# Patient Record
Sex: Female | Born: 1965 | Race: Black or African American | Hispanic: No | Marital: Single | Smoking: Never smoker
Health system: Southern US, Community
[De-identification: ages and names within clinical notes are randomized; demographics above are authoritative.]

## PROBLEM LIST (undated history)

## (undated) DIAGNOSIS — E079 Disorder of thyroid, unspecified: Secondary | ICD-10-CM

## (undated) DIAGNOSIS — M419 Scoliosis, unspecified: Secondary | ICD-10-CM

## (undated) DIAGNOSIS — G4726 Circadian rhythm sleep disorder, shift work type: Secondary | ICD-10-CM

## (undated) HISTORY — PX: FOOT SURGERY: SHX648

## (undated) HISTORY — DX: Circadian rhythm sleep disorder, shift work type: G47.26

## (undated) HISTORY — DX: Scoliosis, unspecified: M41.9

## (undated) HISTORY — DX: Disorder of thyroid, unspecified: E07.9

## (undated) HISTORY — PX: TUBAL LIGATION: SHX77

---

## 1985-04-11 HISTORY — PX: OTHER SURGICAL HISTORY: SHX169

## 2001-03-16 ENCOUNTER — Other Ambulatory Visit: Admission: RE | Admit: 2001-03-16 | Discharge: 2001-03-16 | Payer: Self-pay | Admitting: Obstetrics and Gynecology

## 2002-10-30 ENCOUNTER — Encounter: Payer: Self-pay | Admitting: Family Medicine

## 2002-10-30 ENCOUNTER — Ambulatory Visit (HOSPITAL_COMMUNITY): Admission: RE | Admit: 2002-10-30 | Discharge: 2002-10-30 | Payer: Self-pay | Admitting: Family Medicine

## 2002-12-04 ENCOUNTER — Ambulatory Visit (HOSPITAL_COMMUNITY): Admission: RE | Admit: 2002-12-04 | Discharge: 2002-12-04 | Payer: Self-pay | Admitting: Podiatry

## 2003-08-12 ENCOUNTER — Ambulatory Visit (HOSPITAL_COMMUNITY): Admission: RE | Admit: 2003-08-12 | Discharge: 2003-08-12 | Payer: Self-pay | Admitting: Internal Medicine

## 2003-11-17 ENCOUNTER — Ambulatory Visit (HOSPITAL_COMMUNITY): Admission: RE | Admit: 2003-11-17 | Discharge: 2003-11-17 | Payer: Self-pay | Admitting: Family Medicine

## 2005-08-29 ENCOUNTER — Ambulatory Visit (HOSPITAL_COMMUNITY): Admission: RE | Admit: 2005-08-29 | Discharge: 2005-08-29 | Payer: Self-pay | Admitting: Obstetrics and Gynecology

## 2005-09-21 ENCOUNTER — Ambulatory Visit (HOSPITAL_COMMUNITY): Admission: RE | Admit: 2005-09-21 | Discharge: 2005-09-21 | Payer: Self-pay | Admitting: Obstetrics and Gynecology

## 2007-05-30 ENCOUNTER — Other Ambulatory Visit: Admission: RE | Admit: 2007-05-30 | Discharge: 2007-05-30 | Payer: Self-pay | Admitting: Obstetrics and Gynecology

## 2007-08-27 ENCOUNTER — Ambulatory Visit (HOSPITAL_COMMUNITY): Admission: RE | Admit: 2007-08-27 | Discharge: 2007-08-27 | Payer: Self-pay | Admitting: Family Medicine

## 2008-07-11 ENCOUNTER — Other Ambulatory Visit: Admission: RE | Admit: 2008-07-11 | Discharge: 2008-07-11 | Payer: Self-pay | Admitting: Obstetrics and Gynecology

## 2008-07-15 ENCOUNTER — Ambulatory Visit (HOSPITAL_COMMUNITY): Admission: RE | Admit: 2008-07-15 | Discharge: 2008-07-15 | Payer: Self-pay | Admitting: Obstetrics & Gynecology

## 2008-07-29 ENCOUNTER — Ambulatory Visit: Payer: Self-pay | Admitting: Gastroenterology

## 2008-07-30 ENCOUNTER — Encounter: Payer: Self-pay | Admitting: Internal Medicine

## 2008-07-31 ENCOUNTER — Encounter: Payer: Self-pay | Admitting: Internal Medicine

## 2008-08-25 ENCOUNTER — Ambulatory Visit: Payer: Self-pay | Admitting: Internal Medicine

## 2008-08-25 ENCOUNTER — Ambulatory Visit (HOSPITAL_COMMUNITY): Admission: RE | Admit: 2008-08-25 | Discharge: 2008-08-25 | Payer: Self-pay | Admitting: Internal Medicine

## 2008-08-25 ENCOUNTER — Encounter: Payer: Self-pay | Admitting: Internal Medicine

## 2008-08-25 HISTORY — PX: COLONOSCOPY: SHX5424

## 2008-08-27 ENCOUNTER — Encounter: Payer: Self-pay | Admitting: Internal Medicine

## 2008-08-27 ENCOUNTER — Telehealth (INDEPENDENT_AMBULATORY_CARE_PROVIDER_SITE_OTHER): Payer: Self-pay

## 2009-07-31 ENCOUNTER — Other Ambulatory Visit: Admission: RE | Admit: 2009-07-31 | Discharge: 2009-07-31 | Payer: Self-pay | Admitting: Obstetrics and Gynecology

## 2009-09-08 ENCOUNTER — Ambulatory Visit (HOSPITAL_COMMUNITY): Admission: RE | Admit: 2009-09-08 | Discharge: 2009-09-08 | Payer: Self-pay | Admitting: Obstetrics and Gynecology

## 2010-08-24 NOTE — Op Note (Signed)
NAME:  [name], Tammy                  ACCOUNT NO.:  [id number]   MEDICAL RECORD NO.:  [id number]          PATIENT TYPE:  AMB   LOCATION:  DAY                           FACILITY:  [facility]   PHYSICIAN:  R. Roetta Sessions, M.D. DATE OF BIRTH:  [birth year]   DATE OF PROCEDURE:  08/25/2008  DATE OF DISCHARGE:                               OPERATIVE REPORT   PROCEDURE PERFORMED:  Colonoscopy with biopsy.   INDICATIONS FOR PROCEDURE:  The patient is a  45 year old lady with a  positive family history of colon cancer in her sister who was diagnosed  at age 50.  She is here for high risk screening.  Potential risks,  benefits, alternatives and limitations have been reviewed, questions  have been answered.  She has no lower tract symptoms currently.  All  parties are agreeable.   PROCEDURE NOTE:  Oxygen saturations, blood pressure, pulse and  respirations were monitored throughout the entire procedure.  Conscious  sedation Versed 3 mg IV, Demerol 75 mg IV in divided doses.   INSTRUMENT USED:  Olympus video chip system.   FINDINGS:  Digital rectal exam revealed no abnormalities.   ENDOSCOPIC FINDINGS:  Prep was good.  Colon:  Colonic mucosa was surveyed from the rectosigmoid junction to  the left, transverse and right colon to the area of the appendiceal  orifice and ileocecal valve and cecum.  These structures were well seen  and photographed for the record.  From this level, the scope was slowly  withdrawn.  All previously mentioned mucosal surfaces were again seen.  The patient had a diminutive ascending colon polyp which was cold  biopsied/removed.  Remainder of the colonic mucosa appeared normal.  The  scope was pulled down to the rectum where a thorough examination of the  rectal mucosa including retroflexion view of the anal verge demonstrated  no abnormalities.  The patient tolerated the procedure well, was reacted  in endoscopy.  Cecal withdrawal time 11 minutes.   IMPRESSION:  1.  Normal rectum.  2. Diminutive ascending colon polyp, status post cold biopsy removal.      Remainder of colonic mucosa appeared normal.   RECOMMENDATIONS:  Follow up on pathology.  Further recommendations to  follow.      Jonathon Bellows, M.D.  Electronically Signed     RMR/MEDQ  D:  08/25/2008  T:  08/25/2008  Job:  [id number]   cc:   Kirk Ruths, M.D.  Fax: [phone]

## 2010-08-27 ENCOUNTER — Other Ambulatory Visit: Payer: Self-pay | Admitting: Obstetrics & Gynecology

## 2010-08-27 DIAGNOSIS — Z139 Encounter for screening, unspecified: Secondary | ICD-10-CM

## 2010-08-27 NOTE — Op Note (Signed)
NAME:  Wollen, Jaicey Salena Saner                            ACCOUNT NO.:  [id number]   MEDICAL RECORD NO.:  [id number]                   PATIENT TYPE:  AMB   LOCATION:  DAY                                  FACILITY:  [facility]   PHYSICIAN:  Cody M. Ulice Brilliant, D.P.M.               DATE OF BIRTH:  03/06/1966   DATE OF PROCEDURE:  12/04/2002  DATE OF DISCHARGE:                                 OPERATIVE REPORT   PREOPERATIVE DIAGNOSES:  1. Tailor's bunion deformity, fifth metatarsal, right foot.  2. Hammer toe second digit right foot.  3. Hammer toe third digit right foot.   POSTOPERATIVE DIAGNOSES:  1. Tailor's bunion deformity, fifth metatarsal, right foot.  2. Hammer toe second digit right foot.  3. Hammer toe third digit right foot.   PROCEDURES PERFORMED:  1. Fifth metatarsal osteotomy, right foot.  2. Hammer toe arthrodesis, second digit right foot.  3. Hammer toe arthrodesis, third digit right foot.   SURGEON:  Denny Peon. Ulice Brilliant, D.P.M.   ANESTHESIA:  Monitored anesthesia care.   INDICATION FOR SURGERY:  Painful forefoot deformities consisting of a  tailor's bunion of the right foot with pain along the lateral and plantar  aspect of the fifth metatarsal unresponsive to conservative measures,  including changes in shoe gear and nonsteroidal anti-inflammatory  medication; hammer toes of the second and third digits right foot with  painful hyperkeratotic lesion on third digit right foot.  The patient has  requested surgical correction.   DESCRIPTION OF PROCEDURE:  Ms. Chipman is brought into the OR and placed on the  table in the supine position.  IV sedation is established.  A Mayo block is  performed about the fifth MTP of her right foot.  Further local anesthesia  is administered about the dorsal aspect of the right foot, and a digital  block is performed about the second and third toes.  A pneumatic ankle  tourniquet is then applied over her right ankle.  Her foot is then prepped  and draped in  the usual aseptic fashion.  An Ace bandage is then utilized to  exsanguinate her foot.  The tourniquet is inflated to 250 mmHg.   Procedure #1.  Fifth metatarsal osteotomy, right foot:  Attention is  directed to the fifth metatarsal area.  A 4-5 cm slight curvilinear skin  incision is made from the head of the fifth metatarsal to the midshaft.  The  incision is deepened through subcutaneous tissue via sharp and blunt  dissection.  The extensor tendon to the fifth toe is retracted medially.  A  deep fascial incision is then created.  The capsular and periosteal tissues  are reflected then away from the underlying bony surface, exposing the  surgical neck of the bone.  The osteotomy is then performed with the  orientation of the osteotomy being from distal dorsal medial to plantar  lateral proximal.  The  capital fragment is then relocated medially and  slightly dorsally to place the fifth metatarsal head more medially and  slightly more dorsally to reduce shoe and weightbearing pressure.  With the  capital fragment relocated into its corrected position, it is then fixated  via 2.0 x 12 mm cortical screw in standard AO/ASIS fashion.  The osteotomy  is deemed stable.  The wound is flushed.  Redundant bone laterally is  excised with the bone rongeur.  Deep fascia is then reapproximated and  closed with 4-0 Vicryl in a running horizontal mattress suture.  Subcutaneous tissues are reapproximated and closed in a running horizontal  mattress suture of 4-0 Vicryl.  Skin is closed with 4-0 Vicryl in a  subcuticular suture.   Procedure #2.  Hammer toe arthrodesis, second digit of right foot:  Attention is directed to the second toe.  A 5 cm dorsal linear skin incision  is then made extending in a slight curvilinear skin incision crossing the  second MTP.  The incision is deepened through subcutaneous tissue via blunt  dissection.  The proximal interphalangeal joint and the metatarsophalangeal  joint  are readily identified and good exposure is gained of these.  An  incision is then made transversely across the extensor tendon.  The head of  the proximal phalanx and the base of the intermediate phalanx are exposed.  The articular surface of the head of the proximal phalanx and the base of  the intermediate phalanx are then resected utilizing a 62 blade on the  oscillating saw.  An extensor hood release is then performed.  An MTP  capsulotomy is then performed with the McGlamery elevator utilized to free  up the flexor plate plantarly.  A 0.045 K-wire is then introduced and driven  from the base of the intermediate phalanx through the distal aspect of the  toe.  The K-wire is then retrograded proximally across the proximal  interphalangeal joint through the proximal phalangeal head, and the K-wire  is driven proximally through the proximal phalanx, crossing the  metatarsophalangeal joint.  Care is taken that there is good orientation of  the second toe in all three body planes.  With the K-wire in place and the  opposing bony surfaces of the proximal phalanx and intermediate phalanx  against each other, the wound is flushed.  Closure about the dorsal aspect  of the MTP is created utilizing 4-0 Vicryl in a horizontal mattress suture.  The entire skin incision is then closed from distal to proximal utilizing 4-  0 Prolene in a running interlocking suture.   Procedure #3.  Hammer toe arthrodesis third toe right foot:  The above-noted  procedure that was described on the second toe was then performed on the  third toe; in fact, these two procedures were performed simultaneously.  There was no variation in amount of bone removed or fixation.   Ms. Habermehl is then injected postoperatively about all three incisions with  Marcaine and Hexadrol.  Steri-Strips are applied across the fifth MTP.  A Betadine-soaked Adaptic dressing and a dry sterile compressive dressing  follow.  The tourniquet is then  deflated.   Ms. Breese is taken to recovery from room 3.  While in recovery I speak with  her sister regarding her postoperative instructions.  A prescription for  Lortab 10 mg is dispensed and a prescription also for Phenergan 25 mg is  dispensed.  She will be seen within one week for her first postop visit.  Her sister is  described use of ice, elevation, staying off the foot, and  keeping it dry.                                                Denny Peon. Ulice Brilliant, D.P.M.    CMD/MEDQ  D:  12/04/2002  T:  12/04/2002  Job:  [id number]

## 2010-08-27 NOTE — Op Note (Signed)
NAME:  Tammy [name], Tammy [name]                            ACCOUNT NO.:  [id number]   MEDICAL RECORD NO.:  [id number]                   PATIENT TYPE:  AMB   LOCATION:  DAY                                  FACILITY:  [facility]   PHYSICIAN:  R. Roetta Sessions, M.D.              DATE OF BIRTH:  02/15/1966   DATE OF PROCEDURE:  08/12/2003  DATE OF DISCHARGE:                                 OPERATIVE REPORT   PROCEDURE:  High risk screening colonoscopy.   INDICATIONS FOR PROCEDURE:  The patient is a 45 year old African American  female devoid of any lower GI tract symptoms whose sister at age 89 was  diagnosed with colorectal carcinoma and recently had a resection.  Tammy [name]  has never had her lower GI tract imaged.  Colonoscopy is now being done as a  screening maneuver.  This approach has been discussed with the patient at  length at the bedside.  The potential risks, benefits, and alternatives have  been reviewed and questions answered.  She is agreeable.  Please see my  handwritten H&P for more information.   PROCEDURE:  O2 saturation, blood pressure, pulses, and respirations were  monitored throughout the entirety of the procedure.  Conscious sedation was  with Versed 2 mg IV, Demerol 500 mg IV in divided doses.  The instrument  used was the Olympus video chip system.   FINDINGS:  Digital rectal examination revealed no abnormalities.   ENDOSCOPIC FINDINGS:  The prep was good.   Rectum:  Examination of the rectal mucosa including retroflex view of the  anal verge revealed no abnormalities.   Colon:  The colonic mucosa was surveyed from the rectosigmoid junction  through the left, transverse, right colon to the area of the appendiceal  orifice, ileocecal valve, and cecum.  These structures were well-seen and  photographed for the record.  From this level, the scope was slowly  withdrawn.  All previously mentioned mucosal surfaces were again seen.  The  colonic mucosa appeared normal.  The patient  tolerated the procedure well  and was reactive in endoscopy.   IMPRESSION:  1. Normal rectum.  2. Normal colon.   RECOMMENDATIONS:  Repeat colonoscopy in five years.      ___________________________________________                                            Jonathon Bellows, M.D.   RMR/MEDQ  D:  08/12/2003  T:  08/12/2003  Job:  [id number]   cc:   Tilda Burrow, M.D.  [address] [place]  [place] [address]  Fax: [phone]

## 2010-09-16 ENCOUNTER — Ambulatory Visit (HOSPITAL_COMMUNITY)
Admission: RE | Admit: 2010-09-16 | Discharge: 2010-09-16 | Disposition: A | Discharge status: Home / Self Care | Payer: BC Managed Care – PPO | Source: Ambulatory Visit | Attending: Obstetrics & Gynecology | Admitting: Obstetrics & Gynecology

## 2010-09-16 DIAGNOSIS — Z139 Encounter for screening, unspecified: Secondary | ICD-10-CM

## 2010-09-16 DIAGNOSIS — Z1231 Encounter for screening mammogram for malignant neoplasm of breast: Secondary | ICD-10-CM | POA: Insufficient documentation

## 2011-08-10 ENCOUNTER — Other Ambulatory Visit: Payer: Self-pay | Admitting: Adult Health

## 2011-08-10 DIAGNOSIS — Z139 Encounter for screening, unspecified: Secondary | ICD-10-CM

## 2011-08-31 ENCOUNTER — Other Ambulatory Visit: Payer: Self-pay | Admitting: Adult Health

## 2011-08-31 ENCOUNTER — Other Ambulatory Visit (HOSPITAL_COMMUNITY)
Admission: RE | Admit: 2011-08-31 | Discharge: 2011-08-31 | Disposition: A | Discharge status: Home / Self Care | Payer: BC Managed Care – PPO | Source: Ambulatory Visit | Attending: Obstetrics and Gynecology | Admitting: Obstetrics and Gynecology

## 2011-08-31 DIAGNOSIS — Z1159 Encounter for screening for other viral diseases: Secondary | ICD-10-CM | POA: Insufficient documentation

## 2011-08-31 DIAGNOSIS — Z01419 Encounter for gynecological examination (general) (routine) without abnormal findings: Secondary | ICD-10-CM | POA: Insufficient documentation

## 2011-09-19 ENCOUNTER — Ambulatory Visit (HOSPITAL_COMMUNITY)
Admission: RE | Admit: 2011-09-19 | Discharge: 2011-09-19 | Disposition: A | Discharge status: Home / Self Care | Payer: BC Managed Care – PPO | Source: Ambulatory Visit | Attending: Adult Health | Admitting: Adult Health

## 2011-09-19 DIAGNOSIS — Z1231 Encounter for screening mammogram for malignant neoplasm of breast: Secondary | ICD-10-CM | POA: Insufficient documentation

## 2011-09-19 DIAGNOSIS — Z139 Encounter for screening, unspecified: Secondary | ICD-10-CM

## 2012-08-20 ENCOUNTER — Other Ambulatory Visit: Payer: Self-pay | Admitting: Obstetrics and Gynecology

## 2012-08-20 DIAGNOSIS — Z139 Encounter for screening, unspecified: Secondary | ICD-10-CM

## 2012-09-21 ENCOUNTER — Ambulatory Visit (HOSPITAL_COMMUNITY)
Admission: RE | Admit: 2012-09-21 | Discharge: 2012-09-21 | Disposition: A | Discharge status: Home / Self Care | Payer: BC Managed Care – PPO | Source: Ambulatory Visit | Attending: Obstetrics and Gynecology | Admitting: Obstetrics and Gynecology

## 2012-09-21 DIAGNOSIS — Z1231 Encounter for screening mammogram for malignant neoplasm of breast: Secondary | ICD-10-CM | POA: Insufficient documentation

## 2012-09-21 DIAGNOSIS — Z139 Encounter for screening, unspecified: Secondary | ICD-10-CM

## 2013-04-26 ENCOUNTER — Encounter: Payer: Self-pay | Admitting: Adult Health

## 2013-04-26 ENCOUNTER — Encounter (INDEPENDENT_AMBULATORY_CARE_PROVIDER_SITE_OTHER): Payer: Self-pay

## 2013-04-26 ENCOUNTER — Ambulatory Visit (INDEPENDENT_AMBULATORY_CARE_PROVIDER_SITE_OTHER): Payer: BC Managed Care – PPO | Admitting: Adult Health

## 2013-04-26 VITALS — BP 132/80 | HR 76 | Ht 67.0 in | Wt 172.0 lb

## 2013-04-26 DIAGNOSIS — Z01419 Encounter for gynecological examination (general) (routine) without abnormal findings: Secondary | ICD-10-CM

## 2013-04-26 DIAGNOSIS — G4726 Circadian rhythm sleep disorder, shift work type: Secondary | ICD-10-CM

## 2013-04-26 DIAGNOSIS — Z1212 Encounter for screening for malignant neoplasm of rectum: Secondary | ICD-10-CM

## 2013-04-26 HISTORY — DX: Circadian rhythm sleep disorder, shift work type: G47.26

## 2013-04-26 LAB — HEMOCCULT GUIAC POC 1CARD (OFFICE): Fecal Occult Blood, POC: NEGATIVE

## 2013-04-26 MED ORDER — ZOLPIDEM TARTRATE 5 MG PO TABS: 5.0000 mg | ORAL_TABLET | Freq: Every evening | ORAL | Status: DC | PRN

## 2013-04-26 NOTE — Progress Notes (Signed)
Patient ID: Tammy [name], female   DOB: 02/02/1966, 48 y.o.   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   Current Medications, Allergies, Past Medical History, Past Surgical History, Family History and Social History were reviewed in [facility] electronic medical record.   Past Medical History  Diagnosis Date  . Thyroid disease   . Scoliosis   . 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 Thyroid removed  1987  . Foot surgery Right   . Tubal ligation    Current outpatient prescriptions:levothyroxine (SYNTHROID, LEVOTHROID) 100 MCG tablet, Take 100 mcg by mouth daily., Disp: , Rfl: ;  zolpidem (AMBIEN) 5 MG tablet, Take 1 tablet (5 mg total) by mouth at bedtime as needed for sleep., Disp: 30 tablet, Rfl: 0  Review of Systems: Patient denies any headaches, blurred vision, shortness of breath, chest pain, abdominal pain, problems with bowel movements, urination, or intercourse. No joint swelling or mood swings, no period in 2 years and no hot flashes.She does complain of not sleeping well, she works 3rd shift at [employer].She has used ambien in the past.    Physical Exam:BP 132/80  Pulse 76  Ht 5\' 7"  (1.702 m)  Wt 172 lb (78.019 kg)  BMI 26.93 kg/m2 General:  Well developed, well nourished, no acute distress Skin:  Warm and dry Neck:  Midline trachea, thyroid surgically removed, healed scar, no carotid bruits heard Lungs; Clear to auscultation bilaterally Breast:  No dominant palpable mass, retraction, or nipple discharge Cardiovascular: Regular rate and rhythm Abdomen:  Soft, non tender, no hepatosplenomegaly Pelvic:  External genitalia is normal in appearance.  The vagina is normal in  appearance.The cervix is bulbous.  Uterus is felt to be normal size, shape, and contour.  No  adnexal masses or tenderness noted. Rectal: Good sphincter tone, no polyps, or hemorrhoids felt.  Hemoccult negative. Extremities:  No swelling or varicosities noted Psych:  No mood changes, alert and cooperative,seems happy   Impression: Yearly gyn no pap Shift work sleep distrubance   Plan: Rx Ambien 5 mg #30 1 at hs prn no refills Physical in 1 year Mammogram yearly  Labs with PCP Colonoscopy per GI Review handout on insomnia Will call her about calcified vein probably needs to see PCP

## 2013-04-26 NOTE — Patient Instructions (Signed)
Insomnia Insomnia is frequent trouble falling and/or staying asleep. Insomnia can be a long term problem or a short term problem. Both are common. Insomnia can be a short term problem when the wakefulness is related to a certain stress or worry. Long term insomnia is often related to ongoing stress during waking hours and/or poor sleeping habits. Overtime, sleep deprivation itself can make the problem worse. Every little thing feels more severe because you are overtired and your ability to cope is decreased. CAUSES   Stress, anxiety, and depression.  Poor sleeping habits.  Distractions such as TV in the bedroom.  Naps close to bedtime.  Engaging in emotionally charged conversations before bed.  Technical reading before sleep.  Alcohol and other sedatives. They may make the problem worse. They can hurt normal sleep patterns and normal dream activity.  Stimulants such as caffeine for several hours prior to bedtime.  Pain syndromes and shortness of breath can cause insomnia.  Exercise late at night.  Changing time zones may cause sleeping problems (jet lag). It is sometimes helpful to have someone observe your sleeping patterns. They should look for periods of not breathing during the night (sleep apnea). They should also look to see how long those periods last. If you live alone or observers are uncertain, you can also be observed at a sleep clinic where your sleep patterns will be professionally monitored. Sleep apnea requires a checkup and treatment. Give your caregivers your medical history. Give your caregivers observations your family has made about your sleep.  SYMPTOMS   Not feeling rested in the morning.  Anxiety and restlessness at bedtime.  Difficulty falling and staying asleep. TREATMENT   Your caregiver may prescribe treatment for an underlying medical disorders. Your caregiver can give advice or help if you are using alcohol or other drugs for self-medication. Treatment  of underlying problems will usually eliminate insomnia problems.  Medications can be prescribed for short time use. They are generally not recommended for lengthy use.  Over-the-counter sleep medicines are not recommended for lengthy use. They can be habit forming.  You can promote easier sleeping by making lifestyle changes such as:  Using relaxation techniques that help with breathing and reduce muscle tension.  Exercising earlier in the day.  Changing your diet and the time of your last meal. No night time snacks.  Establish a regular time to go to bed.  Counseling can help with stressful problems and worry.  Soothing music and white noise may be helpful if there are background noises you cannot remove.  Stop tedious detailed work at least one hour before bedtime. HOME CARE INSTRUCTIONS   Keep a diary. Inform your caregiver about your progress. This includes any medication side effects. See your caregiver regularly. Take note of:  Times when you are asleep.  Times when you are awake during the night.  The quality of your sleep.  How you feel the next day. This information will help your caregiver care for you.  Get out of bed if you are still awake after 15 minutes. Read or do some quiet activity. Keep the lights down. Wait until you feel sleepy and go back to bed.  Keep regular sleeping and waking hours. Avoid naps.  Exercise regularly.  Avoid distractions at bedtime. Distractions include watching television or engaging in any intense or detailed activity like attempting to balance the household checkbook.  Develop a bedtime ritual. Keep a familiar routine of bathing, brushing your teeth, climbing into bed at the same   time each night, listening to soothing music. Routines increase the success of falling to sleep faster.  Use relaxation techniques. This can be using breathing and muscle tension release routines. It can also include visualizing peaceful scenes. You can  also help control troubling or intruding thoughts by keeping your mind occupied with boring or repetitive thoughts like the old concept of counting sheep. You can make it more creative like imagining planting one beautiful flower after another in your backyard garden.  During your day, work to eliminate stress. When this is not possible use some of the previous suggestions to help reduce the anxiety that accompanies stressful situations. MAKE SURE YOU:   Understand these instructions.  Will watch your condition.  Will get help right away if you are not doing well or get worse. Document Released: 03/25/2000 Document Revised: 06/20/2011 Document Reviewed: 04/25/2007 [facility] Patient Information 2014 [employer] [employer] [place]. Try ambien  Physical in 1 year Mammogram yearly Colonoscopy per GI Labs with PCP

## 2013-05-01 ENCOUNTER — Telehealth: Payer: Self-pay | Admitting: Adult Health

## 2013-05-01 NOTE — Telephone Encounter (Signed)
Left message to get her to see PCP about calcified vein in neck

## 2013-09-11 ENCOUNTER — Ambulatory Visit (INDEPENDENT_AMBULATORY_CARE_PROVIDER_SITE_OTHER): Payer: BC Managed Care – PPO | Admitting: Gastroenterology

## 2013-09-11 ENCOUNTER — Encounter (INDEPENDENT_AMBULATORY_CARE_PROVIDER_SITE_OTHER): Payer: Self-pay

## 2013-09-11 ENCOUNTER — Other Ambulatory Visit: Payer: BC Managed Care – PPO | Admitting: Internal Medicine

## 2013-09-11 ENCOUNTER — Encounter: Payer: Self-pay | Admitting: Gastroenterology

## 2013-09-11 VITALS — BP 126/83 | HR 90 | Temp 97.9°F | Resp 18 | Ht 67.0 in | Wt 172.6 lb

## 2013-09-11 DIAGNOSIS — Z86010 Personal history of colonic polyps: Secondary | ICD-10-CM

## 2013-09-11 DIAGNOSIS — Z860101 Personal history of adenomatous and serrated colon polyps: Secondary | ICD-10-CM | POA: Insufficient documentation

## 2013-09-11 DIAGNOSIS — Z800 Family history of malignant neoplasm of digestive organs: Secondary | ICD-10-CM | POA: Insufficient documentation

## 2013-09-11 MED ORDER — PEG 3350-KCL-NA BICARB-NACL 420 G PO SOLR: 4000.0000 mL | ORAL | Status: DC

## 2013-09-11 NOTE — Patient Instructions (Signed)
1. Colonoscopy as scheduled. See separate instructions.  

## 2013-09-11 NOTE — Progress Notes (Signed)
Primary Care Physician:  Colette Ribas, MD  Primary Gastroenterologist:  Roetta Sessions, MD   Chief Complaint  Patient presents with  . Follow-up    HPI:  Tammy [name] is a 48 y.o. female here to schedule surveillance colonoscopy for history of colon polyps. She also has FH of CRC, sister at age 35. Patient's last colonoscopy 08/2008. Patient has been doing well. No constipation, diarrhea, melena, rectal bleeding, abdominal pain, vomiting, heartburn, weight loss.  Current Outpatient Prescriptions  Medication Sig Dispense Refill  . levothyroxine (SYNTHROID, LEVOTHROID) 100 MCG tablet Take 100 mcg by mouth daily.      Marland Kitchen zolpidem (AMBIEN) 5 MG tablet Take 1 tablet (5 mg total) by mouth at bedtime as needed for sleep.  30 tablet  0   No current facility-administered medications for this visit.    Allergies as of 09/11/2013  . (No Known Allergies)    Past Medical History  Diagnosis Date  . Thyroid disease   . Scoliosis   . 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 Thyroid removed  1987  . Foot surgery Right   . Tubal ligation    . Colonoscopy  08/25/2008    [id number]:[id number] ascending colon polyp, status post cold biopsy removal/Remainder of colonic mucosa appeared normal/normal rectum. Tubular adenoma    Family History  Problem Relation Age of Onset  . Hypertension Mother   . Cancer Sister 70    colon  . Hypertension Maternal Aunt   . Hyperlipidemia Maternal Aunt   . Cancer Brother     lung    History   Social History  . Marital Status: Single    Spouse Name: N/A    Number of Children: N/A  . Years of Education: N/A   Occupational History  . Not on file.   Social History Main Topics  . Smoking status: Never Smoker   . Smokeless tobacco: Never Used  . Alcohol Use: Yes     Comment: occ  . Drug Use: No  . Sexual Activity: Yes    Birth Control/ Protection: Surgical    Other Topics Concern  . Not on file   Social History Narrative  . No narrative on file      ROS:  General: Negative for anorexia, weight loss, fever, chills, fatigue, weakness. Eyes: Negative for vision changes.  ENT: Negative for hoarseness, difficulty swallowing , nasal congestion. CV: Negative for chest pain, angina, palpitations, dyspnea on exertion, peripheral edema.  Respiratory: Negative for dyspnea at rest, dyspnea on exertion, cough, sputum, wheezing.  GI: See history of present illness. GU:  Negative for dysuria, hematuria, urinary incontinence, urinary frequency, nocturnal urination.  MS: Negative for joint pain, low back pain.  Derm: Negative for rash or itching.  Neuro: Negative for weakness, abnormal sensation, seizure, frequent headaches, memory loss, confusion.  Psych: Negative for anxiety, depression, suicidal ideation, hallucinations.  Endo: Negative for unusual weight change.  Heme: Negative for bruising or bleeding. Allergy: Negative for rash or hives.    Physical Examination:  BP 126/83  Pulse 90  Temp(Src) 97.9 F (36.6 C) (Oral)  Resp 18  Ht 5\' 7"  (1.702 m)  Wt 172 lb 9.6 oz (78.291 kg)  BMI 27.03 kg/m2   General: Well-nourished, well-developed in no acute distress.  Head: Normocephalic, atraumatic.   Eyes: Conjunctiva pink, no icterus. Mouth: Oropharyngeal mucosa moist and pink , no lesions  erythema or exudate. Neck: Supple without thyromegaly, masses, or lymphadenopathy.  Lungs: Clear to auscultation bilaterally.  Heart: Regular rate and rhythm, no murmurs rubs or gallops.  Abdomen: Bowel sounds are normal, nontender, nondistended, no hepatosplenomegaly or masses, no abdominal bruits or    hernia , no rebound or guarding.   Rectal: not performed Extremities: No lower extremity edema. No clubbing or deformities.  Neuro: Alert and oriented x 4 , grossly normal neurologically.  Skin: Warm and dry, no rash or jaundice.   Psych: Alert and  cooperative, normal mood and affect.

## 2013-09-11 NOTE — Assessment & Plan Note (Signed)
48 year old lady who presents to schedule five-year surveillance colonoscopy. She denies any GI symptoms. Family history positive for colon cancer, sister, age 42. She will like to schedule her colonoscopy during her upcoming vacation.  I have discussed the risks, alternatives, benefits with regards to but not limited to the risk of reaction to medication, bleeding, infection, perforation and the patient is agreeable to proceed. Written consent to be obtained.

## 2013-09-12 NOTE — Progress Notes (Signed)
cc'd to pcp 

## 2013-09-16 ENCOUNTER — Encounter (HOSPITAL_COMMUNITY): Payer: Self-pay | Admitting: Pharmacy Technician

## 2013-09-25 ENCOUNTER — Other Ambulatory Visit: Payer: Self-pay | Admitting: Adult Health

## 2013-09-25 DIAGNOSIS — Z1231 Encounter for screening mammogram for malignant neoplasm of breast: Secondary | ICD-10-CM

## 2013-09-26 ENCOUNTER — Ambulatory Visit (HOSPITAL_COMMUNITY)
Admission: RE | Admit: 2013-09-26 | Discharge: 2013-09-26 | Disposition: A | Discharge status: Home / Self Care | Payer: BC Managed Care – PPO | Source: Ambulatory Visit | Attending: Internal Medicine | Admitting: Internal Medicine

## 2013-09-26 ENCOUNTER — Encounter (HOSPITAL_COMMUNITY)
Admission: RE | Disposition: A | Discharge status: Home / Self Care | Payer: Self-pay | Source: Ambulatory Visit | Attending: Internal Medicine

## 2013-09-26 ENCOUNTER — Encounter (HOSPITAL_COMMUNITY): Payer: Self-pay | Admitting: *Deleted

## 2013-09-26 DIAGNOSIS — Z79899 Other long term (current) drug therapy: Secondary | ICD-10-CM | POA: Insufficient documentation

## 2013-09-26 DIAGNOSIS — G479 Sleep disorder, unspecified: Secondary | ICD-10-CM | POA: Insufficient documentation

## 2013-09-26 DIAGNOSIS — Z800 Family history of malignant neoplasm of digestive organs: Secondary | ICD-10-CM

## 2013-09-26 DIAGNOSIS — Z1211 Encounter for screening for malignant neoplasm of colon: Secondary | ICD-10-CM | POA: Insufficient documentation

## 2013-09-26 DIAGNOSIS — Z860100 Personal history of colon polyps, unspecified: Secondary | ICD-10-CM | POA: Insufficient documentation

## 2013-09-26 HISTORY — PX: COLONOSCOPY: SHX5424

## 2013-09-26 SURGERY — COLONOSCOPY
Anesthesia: Moderate Sedation

## 2013-09-26 MED ORDER — ONDANSETRON HCL 4 MG/2ML IJ SOLN
INTRAMUSCULAR | Status: DC | PRN
  Administered 2013-09-26: 4 mg via INTRAVENOUS

## 2013-09-26 MED ORDER — SODIUM CHLORIDE 0.9 % IV SOLN
INTRAVENOUS | Status: DC
  Administered 2013-09-26: 12:00:00 via INTRAVENOUS

## 2013-09-26 MED ORDER — MEPERIDINE HCL 100 MG/ML IJ SOLN
INTRAMUSCULAR | Status: AC
  Filled 2013-09-26: qty 2

## 2013-09-26 MED ORDER — MEPERIDINE HCL 100 MG/ML IJ SOLN
INTRAMUSCULAR | Status: DC | PRN
  Administered 2013-09-26: 50 mg via INTRAVENOUS

## 2013-09-26 MED ORDER — STERILE WATER FOR IRRIGATION IR SOLN
Status: DC | PRN
  Administered 2013-09-26: 12:00:00

## 2013-09-26 MED ORDER — MIDAZOLAM HCL 5 MG/5ML IJ SOLN
INTRAMUSCULAR | Status: DC | PRN
  Administered 2013-09-26: 2 mg via INTRAVENOUS
  Administered 2013-09-26 (×2): 1 mg via INTRAVENOUS

## 2013-09-26 MED ORDER — ONDANSETRON HCL 4 MG/2ML IJ SOLN
INTRAMUSCULAR | Status: AC
  Filled 2013-09-26: qty 2

## 2013-09-26 MED ORDER — MIDAZOLAM HCL 5 MG/5ML IJ SOLN
INTRAMUSCULAR | Status: AC
  Filled 2013-09-26: qty 10

## 2013-09-26 NOTE — H&P (View-Only) (Signed)
Primary Care Physician:  GOLDING, JOHN CABOT, MD  Primary Gastroenterologist:  Michael Rourk, MD   Chief Complaint  Patient presents with  . Follow-up    HPI:  Tammy [name] is a 48 y.o. female here to schedule surveillance colonoscopy for history of colon polyps. She also has FH of CRC, sister at age 44. Patient's last colonoscopy 08/2008. Patient has been doing well. No constipation, diarrhea, melena, rectal bleeding, abdominal pain, vomiting, heartburn, weight loss.  Current Outpatient Prescriptions  Medication Sig Dispense Refill  . levothyroxine (SYNTHROID, LEVOTHROID) 100 MCG tablet Take 100 mcg by mouth daily.      . zolpidem (AMBIEN) 5 MG tablet Take 1 tablet (5 mg total) by mouth at bedtime as needed for sleep.  30 tablet  0   No current facility-administered medications for this visit.    Allergies as of 09/11/2013  . (No Known Allergies)    Past Medical History  Diagnosis Date  . Thyroid disease   . Scoliosis   . 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 Thyroid removed  1987  . Foot surgery Right   . Tubal ligation    . Colonoscopy  08/25/2008    RMR:Diminutive ascending colon polyp, status post cold biopsy removal/Remainder of colonic mucosa appeared normal/normal rectum. Tubular adenoma    Family History  Problem Relation Age of Onset  . Hypertension Mother   . Cancer Sister 44    colon  . Hypertension Maternal Aunt   . Hyperlipidemia Maternal Aunt   . Cancer Brother     lung    History   Social History  . Marital Status: Single    Spouse Name: N/A    Number of Children: N/A  . Years of Education: N/A   Occupational History  . Not on file.   Social History Main Topics  . Smoking status: Never Smoker   . Smokeless tobacco: Never Used  . Alcohol Use: Yes     Comment: occ  . Drug Use: No  . Sexual Activity: Yes    Birth Control/ Protection: Surgical    Other Topics Concern  . Not on file   Social History Narrative  . No narrative on file      ROS:  General: Negative for anorexia, weight loss, fever, chills, fatigue, weakness. Eyes: Negative for vision changes.  ENT: Negative for hoarseness, difficulty swallowing , nasal congestion. CV: Negative for chest pain, angina, palpitations, dyspnea on exertion, peripheral edema.  Respiratory: Negative for dyspnea at rest, dyspnea on exertion, cough, sputum, wheezing.  GI: See history of present illness. GU:  Negative for dysuria, hematuria, urinary incontinence, urinary frequency, nocturnal urination.  MS: Negative for joint pain, low back pain.  Derm: Negative for rash or itching.  Neuro: Negative for weakness, abnormal sensation, seizure, frequent headaches, memory loss, confusion.  Psych: Negative for anxiety, depression, suicidal ideation, hallucinations.  Endo: Negative for unusual weight change.  Heme: Negative for bruising or bleeding. Allergy: Negative for rash or hives.    Physical Examination:  BP 126/83  Pulse 90  Temp(Src) 97.9 F (36.6 C) (Oral)  Resp 18  Ht 5' 7" (1.702 m)  Wt 172 lb 9.6 oz (78.291 kg)  BMI 27.03 kg/m2   General: Well-nourished, well-developed in no acute distress.  Head: Normocephalic, atraumatic.   Eyes: Conjunctiva pink, no icterus. Mouth: Oropharyngeal mucosa moist and pink , no lesions   erythema or exudate. Neck: Supple without thyromegaly, masses, or lymphadenopathy.  Lungs: Clear to auscultation bilaterally.  Heart: Regular rate and rhythm, no murmurs rubs or gallops.  Abdomen: Bowel sounds are normal, nontender, nondistended, no hepatosplenomegaly or masses, no abdominal bruits or    hernia , no rebound or guarding.   Rectal: not performed Extremities: No lower extremity edema. No clubbing or deformities.  Neuro: Alert and oriented x 4 , grossly normal neurologically.  Skin: Warm and dry, no rash or jaundice.   Psych: Alert and  cooperative, normal mood and affect.

## 2013-09-26 NOTE — Op Note (Signed)
[facility] [address] [place], [id number]   COLONOSCOPY PROCEDURE REPORT  PATIENT: Tammy [name], Tammy C.  MR#:         [id number] BIRTHDATE: May 09, 1965 , 48  yrs. old GENDER: Female ENDOSCOPIST: R.  Roetta SessionsMichael Rourk, MD FACP FACG REFERRED BY:  Assunta FoundJohn Golding, M.D. PROCEDURE DATE:  09/26/2013 PROCEDURE:     Surveillance ileocolonoscopy  INDICATIONS: Personal history of colonic adenoma and positive family history of colon cancer  INFORMED CONSENT:  The risks, benefits, alternatives and imponderables including but not limited to bleeding, perforation as well as the possibility of a missed lesion have been reviewed.  The potential for biopsy, lesion removal, etc. have also been discussed.  Questions have been answered.  All parties agreeable. Please see the history and physical in the medical record for more information.  MEDICATIONS: Versed 4 mg IV and Demerol 50 mg IV in divided doses.  DESCRIPTION OF PROCEDURE:  After a digital rectal exam was performed, the EC-[id number] ([id number]  colonoscope was advanced from the anus through the rectum and colon to the area of the cecum, ileocecal valve and appendiceal orifice.  The cecum was deeply intubated.  These structures were well-seen and photographed for the record.  From the level of the cecum and ileocecal valve, the scope was slowly and cautiously withdrawn.  The mucosal surfaces were carefully surveyed utilizing scope tip deflection to facilitate fold flattening as needed.  The scope was pulled down into the rectum where a thorough examination including retroflexion was performed.    FINDINGS:  Adequate preparation.  Normal appearing rectum and colon. The distal 5 cm of terminal ileal mucosa also appeared normal.  THERAPEUTIC / DIAGNOSTIC MANEUVERS PERFORMED:  none  COMPLICATIONS: none  CECAL WITHDRAWAL TIME:  10 minutes  IMPRESSION:  Normal ileocolonoscopy  RECOMMENDATIONS: Repeat examination 5  years.   _______________________________ eSigned:  R. Roetta SessionsMichael Rourk, MD FACP [facility] 09/26/2013 12:56 PM   CC:

## 2013-09-26 NOTE — Discharge Instructions (Signed)

## 2013-09-26 NOTE — Interval H&P Note (Signed)
History and Physical Interval Note:  09/26/2013 12:19 PM  Tammy ComfortEdith C Kirchner  has presented today for surgery, with the diagnosis of HISTORY OF COLON POLYPYS AND FAMILY HISTORY OF COLON CANCER  The various methods of treatment have been discussed with the patient and family. After consideration of risks, benefits and other options for treatment, the patient has consented to  Procedure(s) with comments: COLONOSCOPY (N/A) - 12:15 as a surgical intervention .  The patient's history has been reviewed, patient examined, no change in status, stable for surgery.  I have reviewed the patient's chart and labs.  Questions were answered to the patient's satisfaction.   No change. Colonoscopy per plan. The risks, benefits, limitations, alternatives and imponderables have been reviewed with the patient. Questions have been answered. All parties are agreeable.

## 2013-10-01 ENCOUNTER — Ambulatory Visit (HOSPITAL_COMMUNITY)
Admission: RE | Admit: 2013-10-01 | Discharge: 2013-10-01 | Disposition: A | Discharge status: Home / Self Care | Payer: BC Managed Care – PPO | Source: Ambulatory Visit | Attending: Adult Health | Admitting: Adult Health

## 2013-10-01 DIAGNOSIS — Z1231 Encounter for screening mammogram for malignant neoplasm of breast: Secondary | ICD-10-CM | POA: Insufficient documentation

## 2013-10-04 ENCOUNTER — Encounter (HOSPITAL_COMMUNITY): Payer: Self-pay | Admitting: Internal Medicine

## 2013-10-10 ENCOUNTER — Other Ambulatory Visit: Payer: Self-pay | Admitting: Adult Health

## 2014-02-10 ENCOUNTER — Encounter (HOSPITAL_COMMUNITY): Payer: Self-pay | Admitting: Internal Medicine

## 2014-04-28 ENCOUNTER — Other Ambulatory Visit (HOSPITAL_COMMUNITY)
Admission: RE | Admit: 2014-04-28 | Discharge: 2014-04-28 | Disposition: A | Discharge status: Home / Self Care | Payer: BLUE CROSS/BLUE SHIELD | Source: Ambulatory Visit | Attending: Internal Medicine | Admitting: Internal Medicine

## 2014-04-28 ENCOUNTER — Encounter: Payer: Self-pay | Admitting: Adult Health

## 2014-04-28 ENCOUNTER — Ambulatory Visit (INDEPENDENT_AMBULATORY_CARE_PROVIDER_SITE_OTHER): Payer: BLUE CROSS/BLUE SHIELD | Admitting: Adult Health

## 2014-04-28 VITALS — BP 120/80 | HR 74 | Ht 67.0 in | Wt 175.0 lb

## 2014-04-28 DIAGNOSIS — Z1151 Encounter for screening for human papillomavirus (HPV): Secondary | ICD-10-CM | POA: Insufficient documentation

## 2014-04-28 DIAGNOSIS — Z01419 Encounter for gynecological examination (general) (routine) without abnormal findings: Secondary | ICD-10-CM

## 2014-04-28 DIAGNOSIS — Z1212 Encounter for screening for malignant neoplasm of rectum: Secondary | ICD-10-CM

## 2014-04-28 LAB — HEMOCCULT GUIAC POC 1CARD (OFFICE): Fecal Occult Blood, POC: NEGATIVE

## 2014-04-28 NOTE — Patient Instructions (Signed)
Physical in 1 year Mammogram yearly June Colonoscopy per GI Had labs

## 2014-04-28 NOTE — Progress Notes (Signed)
Patient ID: Tammy [name], female   DOB: 07/19/1965, 49 y.o.   MRN: [id number] History of Present Illness:  Rubin Payordith is a 49 year old black female in for pap and physical.No complaints.Had colonoscopy and mammogram in June 2015.Still working at [employer].  Current Medications, Allergies, Past Medical History, Past Surgical History, Family History and Social History were reviewed in [facility] electronic medical record.     Review of Systems: Patient denies any headaches, blurred vision, shortness of breath, chest pain, abdominal pain, problems with bowel movements, urination, or intercourse. No joint pain ,but has knot left wrist, where injured last year at work, told her to F/U with nurse at work, no mood swings.    Physical Exam:BP 120/80 mmHg  Pulse 74  Ht 5\' 7"  (1.702 m)  Wt 175 lb (79.379 kg)  BMI 27.40 kg/m2 General:  Well developed, well nourished, no acute distress Skin:  Warm and dry Neck:  Midline trachea, thyroid absent Lungs; Clear to auscultation bilaterally Breast:  No dominant palpable mass, retraction, or nipple discharge Cardiovascular: Regular rate and rhythm Abdomen:  Soft, non tender, no hepatosplenomegaly Pelvic:  External genitalia is normal in appearance.  The vagina is normal in appearance.  The cervix is bulbous and smooth, pap with HPV performed.  Uterus is felt to be normal size, shape, and contour.  No   adnexal masses or tenderness noted. Rectal: Good sphincter tone, no polyps, or hemorrhoids felt.  Hemoccult negative. Extremities:  No swelling or varicosities noted, has 3-4 cm nodule left wrist Psych:  No mood changes,alert and cooperative,seems happy Discussed if any bleeding call will get US   Impression: Well woman gyn exam with pap    Plan: Physical in 1 year Mammogram yearly Colonoscopy per GI  Had labs for insurance was normal

## 2014-04-29 LAB — CYTOLOGY - PAP

## 2014-07-02 ENCOUNTER — Other Ambulatory Visit: Payer: Self-pay | Admitting: Adult Health

## 2014-10-14 ENCOUNTER — Other Ambulatory Visit: Payer: Self-pay | Admitting: Obstetrics and Gynecology

## 2014-10-14 DIAGNOSIS — Z1231 Encounter for screening mammogram for malignant neoplasm of breast: Secondary | ICD-10-CM

## 2014-10-23 ENCOUNTER — Ambulatory Visit (HOSPITAL_COMMUNITY)
Admission: RE | Admit: 2014-10-23 | Discharge: 2014-10-23 | Disposition: A | Discharge status: Home / Self Care | Payer: BLUE CROSS/BLUE SHIELD | Source: Ambulatory Visit | Attending: Obstetrics and Gynecology | Admitting: Obstetrics and Gynecology

## 2014-10-23 DIAGNOSIS — Z1231 Encounter for screening mammogram for malignant neoplasm of breast: Secondary | ICD-10-CM

## 2015-05-06 ENCOUNTER — Encounter: Payer: Self-pay | Admitting: Adult Health

## 2015-05-06 ENCOUNTER — Ambulatory Visit (INDEPENDENT_AMBULATORY_CARE_PROVIDER_SITE_OTHER): Payer: BLUE CROSS/BLUE SHIELD | Admitting: Adult Health

## 2015-05-06 VITALS — BP 146/90 | HR 78 | Ht 66.5 in | Wt 176.0 lb

## 2015-05-06 DIAGNOSIS — Z01419 Encounter for gynecological examination (general) (routine) without abnormal findings: Secondary | ICD-10-CM

## 2015-05-06 DIAGNOSIS — Z1211 Encounter for screening for malignant neoplasm of colon: Secondary | ICD-10-CM

## 2015-05-06 LAB — HEMOCCULT GUIAC POC 1CARD (OFFICE): FECAL OCCULT BLD: NEGATIVE

## 2015-05-06 NOTE — Progress Notes (Signed)
Patient ID: Tammy [name], female   DOB: [birth year], 50 y.o.   MRN: [id number] History of Present Illness: Tammy [name] is a 50 year old black female, in for a well woman gyn exam, she had a normal pap with negative HPV 04/28/14.She declines the flu shot. She works night at [employer], and has not slept today yet. PCP is Dr Phillips Odor.  Current Medications, Allergies, Past Medical History, Past Surgical History, Family History and Social History were reviewed in [facility] record.     Review of Systems:  Patient denies any headaches, hearing loss, fatigue, blurred vision, shortness of breath, chest pain, abdominal pain, problems with bowel movements, urination, or intercourse. No joint pain or mood swings.   Physical Exam:BP 146/90 mmHg  Pulse 78  Ht 5' 6.5" (1.689 m)  Wt 176 lb (79.833 kg)  BMI 27.98 kg/m2 General:  Well developed, well nourished, no acute distress Skin:  Warm and dry Neck:  Midline trachea, thyroid surgically absent , good ROM, no lymphadenopathy Lungs; Clear to auscultation bilaterally Breast:  No dominant palpable mass, retraction, or nipple discharge Cardiovascular: Regular rate and rhythm Abdomen:  Soft, non tender, no hepatosplenomegaly Pelvic:  External genitalia is normal in appearance, no lesions.  The vagina is normal in appearance. Urethra has no lesions or masses. The cervix is bulbous.  Uterus is felt to be normal size, shape, and contour.  No adnexal masses or tenderness noted.Bladder is non tender, no masses felt. Rectal: Good sphincter tone, no polyps, or hemorrhoids felt.  Hemoccult negative. Extremities/musculoskeletal:  No swelling or varicosities noted, no clubbing or cyanosis Psych:  No mood changes, alert and cooperative,seems happy   Impression: Well woman gyn exam no pap    Plan:  Labs with PCP Mammogram yearly Colonoscopy per GI Physical in 1 year, pap 2019

## 2015-05-06 NOTE — Patient Instructions (Signed)
Physical  in 1 year ,pap 2019 Mammogram yearly Colonoscopy per GI Labs with PCP 

## 2015-08-21 DIAGNOSIS — E663 Overweight: Secondary | ICD-10-CM | POA: Diagnosis not present

## 2015-08-21 DIAGNOSIS — Z1389 Encounter for screening for other disorder: Secondary | ICD-10-CM | POA: Diagnosis not present

## 2015-08-21 DIAGNOSIS — Z6827 Body mass index (BMI) 27.0-27.9, adult: Secondary | ICD-10-CM | POA: Diagnosis not present

## 2015-08-21 DIAGNOSIS — E890 Postprocedural hypothyroidism: Secondary | ICD-10-CM | POA: Diagnosis not present

## 2015-12-31 ENCOUNTER — Other Ambulatory Visit: Payer: Self-pay | Admitting: Adult Health

## 2015-12-31 DIAGNOSIS — Z1231 Encounter for screening mammogram for malignant neoplasm of breast: Secondary | ICD-10-CM

## 2016-01-08 ENCOUNTER — Ambulatory Visit (HOSPITAL_COMMUNITY)
Admission: RE | Admit: 2016-01-08 | Discharge: 2016-01-08 | Disposition: A | Discharge status: Home / Self Care | Payer: BLUE CROSS/BLUE SHIELD | Source: Ambulatory Visit | Attending: Adult Health | Admitting: Adult Health

## 2016-01-08 DIAGNOSIS — Z1231 Encounter for screening mammogram for malignant neoplasm of breast: Secondary | ICD-10-CM | POA: Diagnosis not present

## 2016-01-19 ENCOUNTER — Emergency Department (HOSPITAL_COMMUNITY)
Admission: EM | Admit: 2016-01-19 | Discharge: 2016-01-19 | Disposition: A | Discharge status: Home / Self Care | Payer: BLUE CROSS/BLUE SHIELD | Attending: Emergency Medicine | Admitting: Emergency Medicine

## 2016-01-19 ENCOUNTER — Encounter (HOSPITAL_COMMUNITY): Payer: Self-pay | Admitting: Emergency Medicine

## 2016-01-19 ENCOUNTER — Emergency Department (HOSPITAL_COMMUNITY): Payer: BLUE CROSS/BLUE SHIELD

## 2016-01-19 DIAGNOSIS — Y939 Activity, unspecified: Secondary | ICD-10-CM | POA: Insufficient documentation

## 2016-01-19 DIAGNOSIS — S161XXA Strain of muscle, fascia and tendon at neck level, initial encounter: Secondary | ICD-10-CM | POA: Diagnosis not present

## 2016-01-19 DIAGNOSIS — S39012A Strain of muscle, fascia and tendon of lower back, initial encounter: Secondary | ICD-10-CM | POA: Insufficient documentation

## 2016-01-19 DIAGNOSIS — Y999 Unspecified external cause status: Secondary | ICD-10-CM | POA: Diagnosis not present

## 2016-01-19 DIAGNOSIS — Z791 Long term (current) use of non-steroidal anti-inflammatories (NSAID): Secondary | ICD-10-CM | POA: Diagnosis not present

## 2016-01-19 DIAGNOSIS — Y929 Unspecified place or not applicable: Secondary | ICD-10-CM | POA: Diagnosis not present

## 2016-01-19 DIAGNOSIS — S199XXA Unspecified injury of neck, initial encounter: Secondary | ICD-10-CM | POA: Diagnosis not present

## 2016-01-19 DIAGNOSIS — M542 Cervicalgia: Secondary | ICD-10-CM | POA: Diagnosis not present

## 2016-01-19 MED ORDER — METHOCARBAMOL 500 MG PO TABS: 500.0000 mg | ORAL_TABLET | Freq: Three times a day (TID) | ORAL | 0 refills | Status: DC

## 2016-01-19 MED ORDER — HYDROCODONE-ACETAMINOPHEN 5-325 MG PO TABS: 1.0000 | ORAL_TABLET | ORAL | 0 refills | Status: DC | PRN
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
## 2016-01-19 NOTE — Discharge Instructions (Signed)
Your blood pressure is elevated at 148/103, otherwise your vital signs within normal limits. The x-ray of your cervical spine is negative for fracture or dislocation. There is noted degenerative and arthritis changes in your neck. Please use ibuprofen every 6 hours for mild pain. Use Robaxin for spasm, use Norco for more severe pain.This medication may cause drowsiness. Please do not drink, drive, or participate in activity that requires concentration while taking this medication. Please see your primary physician, or return to the emergency department if any changes, problems, or concerns.
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
## 2016-05-09 ENCOUNTER — Encounter: Payer: Self-pay | Admitting: Adult Health

## 2016-05-09 ENCOUNTER — Ambulatory Visit (INDEPENDENT_AMBULATORY_CARE_PROVIDER_SITE_OTHER): Payer: BLUE CROSS/BLUE SHIELD | Admitting: Adult Health

## 2016-05-09 VITALS — BP 138/82 | HR 72 | Ht 66.25 in | Wt 181.5 lb

## 2016-05-09 DIAGNOSIS — Z1212 Encounter for screening for malignant neoplasm of rectum: Secondary | ICD-10-CM

## 2016-05-09 DIAGNOSIS — Z01419 Encounter for gynecological examination (general) (routine) without abnormal findings: Secondary | ICD-10-CM

## 2016-05-09 DIAGNOSIS — Z1211 Encounter for screening for malignant neoplasm of colon: Secondary | ICD-10-CM

## 2016-05-09 LAB — HEMOCCULT GUIAC POC 1CARD (OFFICE): FECAL OCCULT BLD: NEGATIVE

## 2016-05-09 NOTE — Progress Notes (Signed)
Patient ID: Tammy [name], female   DOB: 12/16/1965, 51 y.o.   MRN: [id number] History of Present Illness: Rubin Payordith is a 51 year old black female in for a well woman gyn exam, she had a normal pap with negative HPV 04/28/14. PCP is Dr Phillips OdorGolding.   Current Medications, Allergies, Past Medical History, Past Surgical History, Family History and Social History were reviewed in [facility] electronic medical record.     Review of Systems: Patient denies any headaches, hearing loss, fatigue, blurred vision, shortness of breath, chest pain, abdominal pain, problems with bowel movements, urination, or intercourse. No joint pain or mood swings.    Physical Exam:BP 138/82 (BP Location: Left Arm, Patient Position: Sitting, Cuff Size: Normal)   Pulse 72   Ht 5' 6.25" (1.683 m)   Wt 181 lb 8 oz (82.3 kg)   BMI 29.07 kg/m  General:  Well developed, well nourished, no acute distress Skin:  Warm and dry Neck:  Midline trachea, thyroid not felt, had surgical removal, good ROM, no lymphadenopathy Lungs; Clear to auscultation bilaterally Breast:  No dominant palpable mass, retraction, or nipple discharge Cardiovascular: Regular rate and rhythm Abdomen:  Soft, non tender, no hepatosplenomegaly Pelvic:  External genitalia is normal in appearance, no lesions.  The vagina is normal in appearance. Urethra has no lesions or masses. The cervix is smooth.  Uterus is felt to be normal size, shape, and contour.  No adnexal masses or tenderness noted.Bladder is non tender, no masses felt. Rectal: Good sphincter tone, no polyps, or hemorrhoids felt.  Hemoccult negative. Extremities/musculoskeletal:  No swelling or varicosities noted, no clubbing or cyanosis Psych:  No mood changes, alert and cooperative,seems happy PHQ 2 score 0  Impression:  1. Well woman exam with routine gynecological exam   2. Screening for colorectal cancer      Plan:  Physical in 1 year with pap Mammogram yearly  Labs at PCP

## 2016-05-09 NOTE — Patient Instructions (Addendum)
Physical in 1 year with pap Mammogram yearly  Labs at PCP

## 2016-08-11 DIAGNOSIS — E782 Mixed hyperlipidemia: Secondary | ICD-10-CM | POA: Diagnosis not present

## 2016-08-11 DIAGNOSIS — E890 Postprocedural hypothyroidism: Secondary | ICD-10-CM | POA: Diagnosis not present

## 2016-08-11 DIAGNOSIS — Z6827 Body mass index (BMI) 27.0-27.9, adult: Secondary | ICD-10-CM | POA: Diagnosis not present

## 2016-08-11 DIAGNOSIS — E663 Overweight: Secondary | ICD-10-CM | POA: Diagnosis not present

## 2016-08-11 DIAGNOSIS — M810 Age-related osteoporosis without current pathological fracture: Secondary | ICD-10-CM | POA: Diagnosis not present

## 2016-08-11 DIAGNOSIS — Z1389 Encounter for screening for other disorder: Secondary | ICD-10-CM | POA: Diagnosis not present

## 2016-08-18 DIAGNOSIS — R7309 Other abnormal glucose: Secondary | ICD-10-CM | POA: Diagnosis not present

## 2016-12-20 ENCOUNTER — Other Ambulatory Visit: Payer: Self-pay | Admitting: Adult Health

## 2016-12-20 DIAGNOSIS — Z1231 Encounter for screening mammogram for malignant neoplasm of breast: Secondary | ICD-10-CM

## 2017-01-12 ENCOUNTER — Ambulatory Visit (HOSPITAL_COMMUNITY)
Admission: RE | Admit: 2017-01-12 | Discharge: 2017-01-12 | Disposition: A | Discharge status: Home / Self Care | Payer: BLUE CROSS/BLUE SHIELD | Source: Ambulatory Visit | Attending: Adult Health | Admitting: Adult Health

## 2017-01-12 DIAGNOSIS — Z1231 Encounter for screening mammogram for malignant neoplasm of breast: Secondary | ICD-10-CM | POA: Diagnosis not present

## 2017-02-28 DIAGNOSIS — H40013 Open angle with borderline findings, low risk, bilateral: Secondary | ICD-10-CM | POA: Diagnosis not present

## 2017-02-28 DIAGNOSIS — H40053 Ocular hypertension, bilateral: Secondary | ICD-10-CM | POA: Diagnosis not present

## 2017-02-28 DIAGNOSIS — H40003 Preglaucoma, unspecified, bilateral: Secondary | ICD-10-CM | POA: Diagnosis not present

## 2017-05-24 ENCOUNTER — Encounter: Payer: Self-pay | Admitting: Adult Health

## 2017-05-24 ENCOUNTER — Ambulatory Visit: Payer: BLUE CROSS/BLUE SHIELD | Admitting: Adult Health

## 2017-05-24 ENCOUNTER — Other Ambulatory Visit (HOSPITAL_COMMUNITY)
Admission: RE | Admit: 2017-05-24 | Discharge: 2017-05-24 | Disposition: A | Discharge status: Home / Self Care | Payer: BLUE CROSS/BLUE SHIELD | Source: Ambulatory Visit | Attending: Adult Health | Admitting: Adult Health

## 2017-05-24 VITALS — BP 138/70 | HR 80 | Ht 66.75 in | Wt 179.0 lb

## 2017-05-24 DIAGNOSIS — Z1211 Encounter for screening for malignant neoplasm of colon: Secondary | ICD-10-CM

## 2017-05-24 DIAGNOSIS — M25552 Pain in left hip: Secondary | ICD-10-CM | POA: Insufficient documentation

## 2017-05-24 DIAGNOSIS — Z01419 Encounter for gynecological examination (general) (routine) without abnormal findings: Secondary | ICD-10-CM

## 2017-05-24 DIAGNOSIS — Z1212 Encounter for screening for malignant neoplasm of rectum: Secondary | ICD-10-CM

## 2017-05-24 DIAGNOSIS — Z01411 Encounter for gynecological examination (general) (routine) with abnormal findings: Secondary | ICD-10-CM

## 2017-05-24 LAB — HEMOCCULT GUIAC POC 1CARD (OFFICE): Fecal Occult Blood, POC: NEGATIVE

## 2017-05-24 NOTE — Progress Notes (Signed)
Patient ID: Tammy [name], female   DOB: 11/25/1965, 52 y.o.   MRN: [id number] History of Present Illness: Tammy [name] is a 52 year old black female, PM in for well woman gyn exam and pap.She is complaining of pain in left hip, esp on rising, does not radiate.She works 12 hour shifts at [employer] and is in [employer] toed shoes, and left arch hurts at times.No known injury.  PCP is Dr Phillips OdorGolding.   Current Medications, Allergies, Past Medical History, Past Surgical History, Family History and Social History were reviewed in [facility] electronic medical record.     Review of Systems: Patient denies any headaches, hearing loss, fatigue, blurred vision, shortness of breath, chest pain, abdominal pain, problems with bowel movements, urination, or intercourse. No joint pain or mood swings. See HPI for positives.     Physical Exam:BP 138/70 (BP Location: Left Arm, Patient Position: Sitting, Cuff Size: Normal)   Pulse 80   Ht 5' 6.75" (1.695 m)   Wt 179 lb (81.2 kg)   BMI 28.25 kg/m  General:  Well developed, well nourished, no acute distress Skin:  Warm and dry Neck:  Midline trachea, normal thyroid, good ROM, no lymphadenopathy Lungs; Clear to auscultation bilaterally Breast:  No dominant palpable mass, retraction, or nipple discharge Cardiovascular: Regular rate and rhythm Abdomen:  Soft, non tender, no hepatosplenomegaly Pelvic:  External genitalia is normal in appearance, no lesions.  The vagina is normal in appearance. Urethra has no lesions or masses. The cervix is bulbous, and smooth, pap with GC/CHL and pap performed. Uterus is felt to be normal size, shape, and contour.  No adnexal masses or tenderness noted.Bladder is non tender, no masses felt. Rectal: Good sphincter tone, no polyps, or hemorrhoids felt.  Hemoccult negative. Extremities/musculoskeletal:  No swelling, no clubbing or cyanosis,no point tenderness on left hip, maybe a little tender, has spider veins and has 1 cm had knot above bend  of right knee. Psych:  No mood changes, alert and cooperative,seems happy PHQ 2 score 0.  Impression: 1. Encounter for gynecological examination with Papanicolaou smear of cervix   2. Screening for colorectal cancer   3. Hip pain, acute, left       Plan: Physical in 1 year Pap in 3 if normal Mammogram yearly Labs with PCP  Referred to Dr Romeo AppleHarrison for evaluation of left hip pain  Colonoscopy per GI

## 2017-05-26 LAB — CYTOLOGY - PAP
CHLAMYDIA, DNA PROBE: NEGATIVE
Diagnosis: NEGATIVE
HPV (WINDOPATH): NOT DETECTED
Neisseria Gonorrhea: NEGATIVE

## 2017-06-23 ENCOUNTER — Ambulatory Visit: Payer: BLUE CROSS/BLUE SHIELD | Admitting: Orthopedic Surgery

## 2017-06-23 ENCOUNTER — Ambulatory Visit (INDEPENDENT_AMBULATORY_CARE_PROVIDER_SITE_OTHER): Payer: BLUE CROSS/BLUE SHIELD

## 2017-06-23 ENCOUNTER — Encounter: Payer: Self-pay | Admitting: Orthopedic Surgery

## 2017-06-23 VITALS — BP 142/92 | HR 82 | Ht 66.5 in | Wt 175.0 lb

## 2017-06-23 DIAGNOSIS — M545 Low back pain, unspecified: Secondary | ICD-10-CM

## 2017-06-23 DIAGNOSIS — M47816 Spondylosis without myelopathy or radiculopathy, lumbar region: Secondary | ICD-10-CM | POA: Diagnosis not present

## 2017-06-23 MED ORDER — PREDNISONE 10 MG (48) PO TBPK: ORAL_TABLET | Freq: Every day | ORAL | 1 refills | Status: DC

## 2017-06-23 NOTE — Progress Notes (Signed)
Patient ID: Tammy [name], female   DOB: [birth year], 52 y.o.   MRN: [id number]  Chief Complaint  Patient presents with  . Back Pain    down left leg     HPI Tammy [name] is a 52 y.o. female.  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  Pain is mild to moderate.  She denies any numbness or tingling or weakness in the left leg  Review of Systems Review of Systems  Constitutional: Negative for fever and unexpected weight change.  Gastrointestinal: Negative.   Genitourinary: Negative.   Neurological: Negative for numbness.    Past Medical History:  Diagnosis Date  . Scoliosis   . 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 Thyroid disease     Past Surgical History:  Procedure Laterality Date  . COLONOSCOPY  08/25/2008   ION:[id number] ascending colon polyp, status post cold biopsy removal/Remainder of colonic mucosa appeared normal/normal rectum. Tubular adenoma  . COLONOSCOPY N/A 09/26/2013   Procedure: COLONOSCOPY;  Surgeon: Corbin Ade, MD;  [facility]: [facility];  Service: Endoscopy;  Laterality: N/A;  12:15  . FOOT SURGERY Right   . thyroid removed  1987  . TUBAL LIGATION      Family History  Problem Relation Age of Onset  . Hypertension Mother   . Cancer Sister 6       colon  . Hypertension Maternal Aunt   . Hyperlipidemia Maternal Aunt   . Cancer Maternal Aunt        ovarian; had hyst  . Cancer Maternal Uncle        lung   was reviewed  Social History Social History   Tobacco Use  . Smoking status: Never Smoker  . Smokeless tobacco: Never Used  Substance Use Topics  . Alcohol use: Yes    Comment: occ  . Drug use: No    No Known Allergies  Current Outpatient Medications   Medication Sig Dispense Refill  . celecoxib (CELEBREX) 200 MG capsule Take 200 mg by mouth 2 (two) times daily.    Marland Kitchen levothyroxine (SYNTHROID, LEVOTHROID) 100 MCG tablet Take 100 mcg by mouth daily.    . Multiple Vitamin (MULTIVITAMIN) tablet Take 1 tablet by mouth daily.    . Omega-3 Fatty Acids (OMEGA 3 PO) Take by mouth. Takes 2 daily    . predniSONE (STERAPRED UNI-PAK 48 TAB) 10 MG (48) TBPK tablet Take by mouth daily. 10MG  DS 12 DAY DOSE PACK AS DIRECTED 48 tablet 1   No current facility-administered medications for this visit.        Physical Exam BP (!) 142/92   Pulse 82   Ht 5' 6.5" (1.689 m)   Wt 175 lb (79.4 kg)   BMI 27.82 kg/m  Physical Exam Gen. appearance: The patient is well-developed and well-nourished grooming and hygiene are normal The patient is oriented to person place and time The patient's mood is normal and the affect is normal  Ortho Exam Gait assessment: The patient stands with normal gait and station  Lumbar spine Tenderness  to palpation is noted in the lower L4-5 and 5 S1 segment  Range of motion normal normal Muscle tone normal muscle  tone on the right and left sides of the spine  Lower extremities right and left Normal range of motion hip knee and ankle All 3 joints are reduced and stable  Strength right lower extremity normal Strength left lower extremity normal  Neurologic right lower extremity examination  Reflexes were 2+ and equal at the knee and 1+ and equal at the ankle    Sensation was normal in both feet and legs    Babinski's tests were down going  Straight leg raise testing was  normal bilaterally  The vascular examination revealed normal dorsalis pedis pulses in both feet and both feet were warm with good capillary refill   MEDICAL DECISION SECTION  xrays ordered?  Yes  My independent reading of xrays: Facet arthritis at L3-4 L4-5 L5-S1 mild increase in lordosis slight coronal plane malalignment normal disc spaces  lumbar spondylosis   Encounter Diagnoses  Name Primary?  . Lumbar pain Yes  . Lumbar spondylosis      PLAN:   Meds ordered this encounter  Medications  . predniSONE (STERAPRED UNI-PAK 48 TAB) 10 MG (48) TBPK tablet    Sig: Take by mouth daily. 10MG  DS 12 DAY DOSE PACK AS DIRECTED    Dispense:  48 tablet    Refill:  1   Injection?  No MRI/CT/?  No  3-week follow-up

## 2017-06-23 NOTE — Progress Notes (Signed)
X °

## 2017-07-14 ENCOUNTER — Ambulatory Visit: Payer: BLUE CROSS/BLUE SHIELD | Admitting: Orthopedic Surgery

## 2017-07-14 ENCOUNTER — Encounter: Payer: Self-pay | Admitting: Orthopedic Surgery

## 2017-07-14 VITALS — BP 149/92 | HR 80 | Ht 66.5 in | Wt 179.0 lb

## 2017-07-14 DIAGNOSIS — M545 Low back pain, unspecified: Secondary | ICD-10-CM

## 2017-07-14 DIAGNOSIS — M47816 Spondylosis without myelopathy or radiculopathy, lumbar region: Secondary | ICD-10-CM

## 2017-07-14 DIAGNOSIS — M5410 Radiculopathy, site unspecified: Secondary | ICD-10-CM | POA: Diagnosis not present

## 2017-07-14 MED ORDER — GABAPENTIN 100 MG PO CAPS: 300.0000 mg | ORAL_CAPSULE | Freq: Every day | ORAL | 2 refills | Status: DC

## 2017-07-14 NOTE — Progress Notes (Signed)
Progress Note   Patient ID: Tammy [name], female   DOB: 01/22/1966, 52 y.o.   MRN: [id number]  Chief Complaint  Patient presents with  . Back Pain    better but still has some left side pain down inner thigh into left great toe/ steroids helped    52 year old female with lumbar spine pain left side radiation left leg x-rays show degenerative disc disease treated with steroids with improvement in overall pain with occasional residual left leg pain radiating to the great toe      Review of Systems  Gastrointestinal: Negative.   Genitourinary: Negative.    Current Meds  Medication Sig  . celecoxib (CELEBREX) 200 MG capsule Take 200 mg by mouth 2 (two) times daily.  Marland Kitchen. levothyroxine (SYNTHROID, LEVOTHROID) 100 MCG tablet Take 100 mcg by mouth daily.  . Multiple Vitamin (MULTIVITAMIN) tablet Take 1 tablet by mouth daily.  . Omega-3 Fatty Acids (OMEGA 3 PO) Take by mouth. Takes 2 daily    No Known Allergies   BP (!) 149/92   Pulse 80   Ht 5' 6.5" (1.689 m)   Wt 179 lb (81.2 kg)   BMI 28.46 kg/m   Physical Exam  Constitutional: She is oriented to person, place, and time. She appears well-developed and well-nourished.  Musculoskeletal:       Arms: Neurological: She is alert and oriented to person, place, and time.  Psychiatric: She has a normal mood and affect. Judgment normal.  Vitals reviewed.    Medical decision-making Encounter Diagnoses  Name Primary?  . Lumbar pain   . Lumbar spondylosis Yes  . Radicular syndrome of left leg       Meds ordered this encounter  Medications  . gabapentin (NEURONTIN) 100 MG capsule    Sig: Take 3 capsules (300 mg total) by mouth at bedtime.    Dispense:  90 capsule    Refill:  2   Recommend physical therapy 3 times a week for 6 weeks  Gabapentin 100 mg 3 at night  Follow-up 3 months  Tammy CanadaStanley Harrison, MD 07/14/2017 8:55 AM

## 2017-07-14 NOTE — Addendum Note (Signed)
Addended byCaffie Damme: Korissa Horsford W on: 07/14/2017 08:59 AM   Modules accepted: Orders

## 2017-07-25 ENCOUNTER — Ambulatory Visit (HOSPITAL_COMMUNITY): Payer: BLUE CROSS/BLUE SHIELD | Attending: Orthopedic Surgery

## 2017-07-25 ENCOUNTER — Encounter (HOSPITAL_COMMUNITY): Payer: Self-pay

## 2017-07-25 DIAGNOSIS — G8929 Other chronic pain: Secondary | ICD-10-CM

## 2017-07-25 DIAGNOSIS — R29898 Other symptoms and signs involving the musculoskeletal system: Secondary | ICD-10-CM | POA: Diagnosis not present

## 2017-07-25 DIAGNOSIS — M6281 Muscle weakness (generalized): Secondary | ICD-10-CM | POA: Diagnosis not present

## 2017-07-25 DIAGNOSIS — M545 Low back pain: Secondary | ICD-10-CM | POA: Insufficient documentation

## 2017-07-25 DIAGNOSIS — M79605 Pain in left leg: Secondary | ICD-10-CM | POA: Diagnosis not present

## 2017-07-25 NOTE — Patient Instructions (Signed)
  FROG Hip adductor and groin stretch supine  Lying supine with the knees bent, feet together, let the knees fall out to the side until a stretch is felt through the groin.    Perform 2x/day, 3-5 stretches holding for 30-60 seconds   Prone Quad Stretch  Lie down flat on your stomach. Wrap a strap (belt, towel, dog leash) around the top of one of your feet and pull the strap across your opposite shoulder so that your knee starts to curl up to your body. Pull until a stretch is felt across the front of your thigh.     Perform 2x/day, 3-5 stretches holding for 30-60 seconds

## 2017-07-25 NOTE — Therapy (Addendum)
[facility] [address] [place], [place], [address] Phone: [phone]   Fax:  [phone]  Physical Therapy Evaluation  Patient Details  Name: Tammy [name] MRN: [id number] Date of Birth: 07/12/1965 Referring Provider: Fuller CanadaStanley Harrison, MD   Encounter Date: 07/25/2017  PT End of Session - 07/25/17 0809    Visit Number  1    Number of Visits  9    Date for PT Re-Evaluation  08/22/17    Authorization Type  [employer] Other    Authorization Time Period  07/25/17 to 08/22/17    Authorization - Visit Number  1    Authorization - Number of Visits  60    PT Start Time  0813    PT Stop Time  0859    PT Time Calculation (min)  46 min    Activity Tolerance  Patient tolerated treatment well;No increased pain    Behavior During Therapy  WFL for tasks assessed/performed       Past Medical History:  Diagnosis Date  . Scoliosis   . 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 Thyroid disease     Past Surgical History:  Procedure Laterality Date  . COLONOSCOPY  08/25/2008   ONG:[id number]:Diminutive ascending colon polyp, status post cold biopsy removal/Remainder of colonic mucosa appeared normal/normal rectum. Tubular adenoma  . COLONOSCOPY N/A 09/26/2013   Procedure: COLONOSCOPY;  Surgeon: Corbin Adeobert M Rourk, MD;  [facility]: [facility];  Service: Endoscopy;  Laterality: N/A;  12:15  . FOOT SURGERY Right   . thyroid removed  1987  . TUBAL LIGATION      There were no vitals filed for this visit.   Subjective Assessment - 07/25/17 0816    Subjective  Pt states that she has been having pain from her L lower back which comes around to her L anterior thigh and then can go down to her L big toe. Dr. Romeo Appleharrison gave her steroids which did help some and when she f/u with him, he gave her Gabapentin and since starting that, she hasn't had that sharp pain down her leg. She denies any n/t or b/b issues. This pain started in October 2018,  insidiously. She works at [employer] and states she has to push and pull a lot of heavy machinery. Prior to taking the Gabapentin, getting up out of bed, working, bending forward/stooping forward all aggravates her pain; relieving factors prior to the pain meds was use of her TENS unit, heat and ice helped. She currently limited in her work duties and beign able to get up and go due to this back pain.     Pertinent History  scoliosis    Limitations  Lifting;House hold activities    How long can you sit comfortably?  no issues    How long can you stand comfortably?  no issues    How long can you walk comfortably?  no issues    Patient Stated Goals  no pain    Currently in Pain?  No/denies         [facility] - 07/25/17 0001      Assessment   Medical Diagnosis  Lumbar pain, lumbar spondylosis, radicular syndrome of left leg    Referring Provider  Fuller CanadaStanley Harrison, MD    Onset Date/Surgical Date  -- October 2018    Next MD Visit  July 2019    Prior Therapy  no PT, chiropractic work  Balance Screen   Has the patient fallen in the past 6 months  No    Has the patient had a decrease in activity level because of a fear of falling?   No    Is the patient reluctant to leave their home because of a fear of falling?   No      Prior Function   Level of Independence  Independent    Vocation  Full time employment    Vocation Requirements  works at [employer]; Water engineer    Leisure  go to the movies, out to eat, take cruises      Observation/Other Assessments   Focus on Therapeutic Outcomes (FOTO)   35% limitation      Sensation   Light Touch  Appears Intact      Posture/Postural Control   Posture/Postural Control  Postural limitations    Postural Limitations  Increased thoracic kyphosis;Increased lumbar lordosis      ROM / Strength   AROM / PROM / Strength  AROM;Strength;PROM      AROM   Overall AROM Comments  --    AROM Assessment Site  Lumbar    Lumbar  Flexion  50% limited, in lordosis    Lumbar Extension  25% limited    Lumbar - Right Side Bend  WFL    Lumbar - Left Side Bend  WFL    Lumbar - Right Rotation  25% limited    Lumbar - Left Rotation  25% limited      PROM   PROM Assessment Site  Hip    Right/Left Hip  Right    Right Hip External Rotation   -- WNL    Right Hip Internal Rotation   -- WNL, recreated L hip pain    Left Hip Flexion  -- WNL    Left Hip External Rotation   -- WNL    Left Hip Internal Rotation   -- WNL, recreated L hip pain    Left Hip ABduction  -- WNL, recreated medial L thigh pain indicating tight hip add      Strength   Strength Assessment Site  Hip;Knee;Ankle    Right Hip Flexion  4+/5    Right Hip Extension  4-/5    Right Hip ABduction  4/5    Left Hip Flexion  4+/5    Left Hip Extension  4-/5    Left Hip ABduction  4/5    Right Knee Flexion  4+/5    Right Knee Extension  5/5    Left Knee Flexion  4+/5    Left Knee Extension  5/5    Right Ankle Dorsiflexion  5/5    Left Ankle Dorsiflexion  5/5      Palpation   Spinal mobility  hypomobile thoracic and lumbar spine, non-painful, just stiffness felt    SI assessment   neg SI cluster testing (Gaenslen's Test positve on L, as she reported recreation of L hip pain)    Palpation comment  increased soft tissue restrictions of lumbar paraspinals and glutes bil but non-painful; L hip adductors with increased restrictions, tender to palpation and recreated pt's pain      Special Tests    Special Tests  Hip Special Tests    Other special tests  long axis hip distraction decreased pain on L    Hip Special Tests   Maisie Fus Test;Ely's Test;Hip Scouring      Thomas Test    Findings  Negative    Side  Right;Left    Comments  neg for hip flexor tightness but BLE noted to deviate laterally and knees slightly extended indicating tight rectus femoris      Ely's Test   Findings  Positive    Side  Right;Left    Comments  R>L      Hip Scouring   Findings   Negative    Side  Right;Left      Balance   Balance Assessed  Yes      Static Standing Balance   Static Standing - Balance Support  No upper extremity supported    Static Standing Balance -  Activities   Single Leg Stance - Right Leg;Single Leg Stance - Left Leg    Static Standing - Comment/# of Minutes  R: 23 sec, L: 15 sec          Objective measurements completed on examination: See above findings.       PT Education - 07/25/17 0908    Education provided  Yes    Education Details  exam findings, POC, HEP    Person(s) Educated  Patient    Methods  Explanation;Handout    Comprehension  Verbalized understanding       PT Short Term Goals - 07/25/17 0923      PT SHORT TERM GOAL #1   Title  Pt will be independent with HEP and perform consisntely in order to decrease pain.    Time  2    Period  Weeks    Status  New    Target Date  08/08/17      PT SHORT TERM GOAL #2   Title  Pt will be able to perform bil SLS for 30 sec on BLE to demo improved functional BLE strength and maximize function at work.    Time  2    Period  Weeks    Status  New        PT Long Term Goals - 07/25/17 1610      PT LONG TERM GOAL #1   Title  Pt will have imrpoved bil proximal hip strength to decrease overall pain and maximize function at work.     Time  4    Period  Weeks    Status  New    Target Date  08/22/17      PT LONG TERM GOAL #2   Title  Pt will have no pain with bilateral passive hip IR ROM, Thomas Test on the L, and passive hip abd on the L to demo improved L quad/hip flexor and L hip adductor flexibility in order to decrease overall pain.    Time  4    Period  Weeks    Status  New      PT LONG TERM GOAL #3   Title  Pt will report participating in her regular exercise routine at least 2-3x/week without L sided LBP or LLE pain in order to demo improved overall function and maximize pt's QOL.     Time  4    Period  Weeks    Status  New             Plan -  07/25/17 0909    Clinical Impression Statement  Pt is pleasant 51YO F who presents to OPPT with c/o L sided LBP and L leg pain. Pt states it started insidiously around October 2018. She works at [employer] and has to lift, push, and pull heavy items for work. Pt was prescribed Gabapentin  and ever since, her L leg pain has decreased. She currently presents with deficits in lumbar and thoracic ROM, BLE proximal hip strength, functional strength, balance, and flexibility. Pt also had increased soft tissue restrictions of bil paraspinals and glutes, but reported only minimal pain with palpation and stated this did not recreate her pain. Pt did state that palpation to L hip adductors, bil passive hip IR, passive L hip abd, and Thomas test on the L all recreated her LBP and L leg pain, indicating potential pathology of L hip adductors, L hip flexors/L rectus femoris, and/or L hip joint. Pt's leg length symmetrical. Pt needs skilled PT intervention to address these impairments in order to decrease pain and maximize overall function at home and work.    History and Personal Factors relevant to plan of care:  h/o upper thoracic scoliosis, thyroid disease, otherwise, healthy individual; still working full time    Clinical Presentation  Stable    Clinical Presentation due to:  MMT, ROM, balance, palpation, flexibility, functional strength    Clinical Decision Making  Low    Rehab Potential  Good    PT Frequency  2x / week    PT Duration  4 weeks    PT Treatment/Interventions  ADLs/Self Care Home Management;Cryotherapy;Electrical Stimulation;Moist Heat;Traction;Ultrasound;Gait training;Stair training;Functional mobility training;Therapeutic activities;Therapeutic exercise;Balance training;Neuromuscular re-education;Patient/family education;Manual techniques;Passive range of motion;Dry needling;Energy conservation;Taping    PT Next Visit Plan  review goals and HEP; initiate functional BLE and core strenghtening; perform  manual hip distraction for pain control, manual STM to L hip adductors, spinal joint mobs if with PT; begin sidelying thoracic rotations, 3D thoracic excursions, SKTC, piriformis, and child's pose stretching    PT Home Exercise Plan  eval: supine frog stretch, prone quad stretch    Consulted and Agree with Plan of Care  Patient       Patient will benefit from skilled therapeutic intervention in order to improve the following deficits and impairments:  Decreased balance, Decreased range of motion, Decreased strength, Hypomobility, Increased fascial restricitons, Increased muscle spasms, Impaired flexibility, Improper body mechanics, Postural dysfunction, Pain  Visit Diagnosis: Chronic left-sided low back pain without sciatica - Plan: PT plan of care cert/re-cert  Pain in left leg - Plan: PT plan of care cert/re-cert  Muscle weakness (generalized) - Plan: PT plan of care cert/re-cert  Other symptoms and signs involving the musculoskeletal system - Plan: PT plan of care cert/re-cert     Problem List Patient Active Problem List   Diagnosis Date Noted  . Encounter for gynecological examination with Papanicolaou smear of cervix 05/24/2017  . Hip pain, acute, left 05/24/2017  . Hx of adenomatous colonic polyps 09/11/2013  . FHx: colon cancer 09/11/2013  . 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 [place], [place], [address] Phone: [phone]   Fax:  [phone]  Name: Tammy [name] MRN: [id number] Date of Birth: 10-09-1965

## 2017-07-26 ENCOUNTER — Ambulatory Visit (HOSPITAL_COMMUNITY): Payer: BLUE CROSS/BLUE SHIELD

## 2017-07-26 ENCOUNTER — Encounter (HOSPITAL_COMMUNITY): Payer: Self-pay

## 2017-07-26 DIAGNOSIS — M6281 Muscle weakness (generalized): Secondary | ICD-10-CM

## 2017-07-26 DIAGNOSIS — R29898 Other symptoms and signs involving the musculoskeletal system: Secondary | ICD-10-CM | POA: Diagnosis not present

## 2017-07-26 DIAGNOSIS — G8929 Other chronic pain: Secondary | ICD-10-CM | POA: Diagnosis not present

## 2017-07-26 DIAGNOSIS — M545 Low back pain, unspecified: Secondary | ICD-10-CM

## 2017-07-26 DIAGNOSIS — M79605 Pain in left leg: Secondary | ICD-10-CM | POA: Diagnosis not present

## 2017-07-26 NOTE — Therapy (Signed)
[facility] [address] [place], [place], [address] Phone: [phone]   Fax:  [phone]  Physical Therapy Treatment  Patient Details  Name: Tammy [name] MRN: [id number] Date of Birth: 04-25-1965 Referring Provider: Fuller Canada, MD   Encounter Date: 07/26/2017  PT End of Session - 07/26/17 0821    Visit Number  2    Number of Visits  9    Date for PT Re-Evaluation  08/22/17    Authorization Type  BCBS Other    Authorization Time Period  07/25/17 to 08/22/17    Authorization - Visit Number  2    Authorization - Number of Visits  60    PT Start Time  0816    PT Stop Time  0914 Reduce 8 min of charge due to cramping and pt walking it out    PT Time Calculation (min)  58 min    Activity Tolerance  Patient tolerated treatment well;No increased pain    Behavior During Therapy  WFL for tasks assessed/performed       Past Medical History:  Diagnosis Date  . Scoliosis   . 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 Thyroid disease     Past Surgical History:  Procedure Laterality Date  . COLONOSCOPY  08/25/2008   XLK:[id number] ascending colon polyp, status post cold biopsy removal/Remainder of colonic mucosa appeared normal/normal rectum. Tubular adenoma  . COLONOSCOPY N/A 09/26/2013   Procedure: COLONOSCOPY;  Surgeon: Corbin Ade, MD;  [facility];  Service: Endoscopy;  Laterality: N/A;  12:15  . FOOT SURGERY Right   . thyroid removed  1987  . TUBAL LIGATION      There were no vitals filed for this visit.  Subjective Assessment - 07/26/17 0818    Subjective  Pt done with 12 hours of work prior PT session today.  Pt stated her Lt side of lower back and hip are sore today, feels like she has had a good workout.  Current pain scale 2/10.    Patient Stated Goals  no pain    Currently in Pain?  Yes    Pain Score  2     Pain Location  Back    Pain Orientation  Lower;Left    Pain  Descriptors / Indicators  Sore    Pain Type  Chronic pain    Pain Onset  More than a month ago    Pain Frequency  Constant    Aggravating Factors   standing, working    Pain Relieving Factors  TENS, ice/ heat    Effect of Pain on Daily Activities  working through it.                       [facility] Adult PT Treatment/Exercise - 07/26/17 0001      Bed Mobility   Bed Mobility  Left Sidelying to Sit    Left Sidelying to Sit  5: Supervision    Left Sidelying to Sit Details (indicate cue type and reason)  Instructed lumbar roll to reduce strain on lower back      Posture/Postural Control   Posture/Postural Control  Postural limitations    Postural Limitations  Increased thoracic kyphosis;Increased lumbar lordosis      Exercises   Exercises  Lumbar      Lumbar Exercises: Stretches   Active Hamstring Stretch  2 reps;30 seconds    Active Hamstring  Stretch Limitations  supine with hands behind thigh    Single Knee to Chest Stretch  2 reps;30 seconds;Right;Left    Quad Stretch  2 reps;30 seconds    Quad Stretch Limitations  prone with rope    Piriformis Stretch  Right;Left;3 reps;30 seconds    Figure 4 Stretch  3 reps;30 seconds;Supine    Figure 4 Stretch Limitations  towel assistance    Other Lumbar Stretch Exercise  FROG st for adductor/groin with TA contraction/ posterior pelvic tilt 3x 30"    Other Lumbar Stretch Exercise  Child's pose 2x 30"      Lumbar Exercises: Seated   Other Seated Lumbar Exercises  3D thoracic excursion      Lumbar Exercises: Supine   Ab Set  10 reps;3 seconds    AB Set Limitations  TA activation with tactile and verbal cueing    Bridge  10 reps;3 seconds      Lumbar Exercises: Sidelying   Other Sidelying Lumbar Exercises  thoracic rotation 10x 5"      Manual Therapy   Manual Therapy  Manual Traction;Soft tissue mobilization    Manual therapy comments  Manual complete separate than rest of tx    Soft tissue mobilization  Soft tissue  mobilization to Lt adductors and medial hamstring    Manual Traction  Lt hip distriction 3x 30"             PT Education - 07/26/17 0824    Education provided  Yes    Education Details  reviewed goals, assured compliance with HEP and copy of eval given to pt.    Person(s) Educated  Patient    Methods  Explanation;Demonstration;Handout    Comprehension  Verbalized understanding;Returned demonstration       PT Short Term Goals - 07/25/17 0923      PT SHORT TERM GOAL #1   Title  Pt will be independent with HEP and perform consisntely in order to decrease pain.    Time  2    Period  Weeks    Status  New    Target Date  08/08/17      PT SHORT TERM GOAL #2   Title  Pt will be able to perform bil SLS for 30 sec on BLE to demo improved functional BLE strength and maximize function at work.    Time  2    Period  Weeks    Status  New        PT Long Term Goals - 07/25/17 16100925      PT LONG TERM GOAL #1   Title  Pt will have imrpoved bil proximal hip strength to decrease overall pain and maximize function at work.     Time  4    Period  Weeks    Status  New    Target Date  08/22/17      PT LONG TERM GOAL #2   Title  Pt will have no pain with bilateral passive hip IR ROM, Thomas Test on the L, and passive hip abd on the L to demo improved L quad/hip flexor and L hip adductor flexibility in order to decrease overall pain.    Time  4    Period  Weeks    Status  New      PT LONG TERM GOAL #3   Title  Pt will report participating in her regular exercise routine at least 2-3x/week without L sided LBP or LLE pain in order to demo improved  overall function and maximize pt's QOL.     Time  4    Period  Weeks    Status  New            Plan - 07/26/17 4098    Clinical Impression Statement  Reviewed goals, assured compliance iwht HEP and copy of eval given to pt.  Pt instructed TA contraction to perform during FROG stretch to addressed increased lordotic curvature with  position for core strenghtneing and LBP relief during stretch.  Session focus on hip mobility and spinal mobility.  Pt complianed of "charlie horses" during stretches in BLE adductors and hamstrings, required to stand up and walk around to resolve cramping.  EOS with manual soft tissue mobilization to address restrictions in adductor and medial hamstring region.  Positive results iwth manual hip distractions reports relief following.  No reports of pain through session.      Rehab Potential  Good    PT Frequency  2x / week    PT Duration  4 weeks    PT Treatment/Interventions  ADLs/Self Care Home Management;Cryotherapy;Electrical Stimulation;Moist Heat;Traction;Ultrasound;Gait training;Stair training;Functional mobility training;Therapeutic activities;Therapeutic exercise;Balance training;Neuromuscular re-education;Patient/family education;Manual techniques;Passive range of motion;Dry needling;Energy conservation;Taping    PT Next Visit Plan  initiate functional BLE and core strenghtening; perform manual hip distraction for pain control, manual STM to L hip adductors, spinal joint mobs if with PT; Begin 3D thoracic excursions next session.      PT Home Exercise Plan  eval: supine frog stretch, prone quad stretch       Patient will benefit from skilled therapeutic intervention in order to improve the following deficits and impairments:  Decreased balance, Decreased range of motion, Decreased strength, Hypomobility, Increased fascial restricitons, Increased muscle spasms, Impaired flexibility, Improper body mechanics, Postural dysfunction, Pain  Visit Diagnosis: Chronic left-sided low back pain without sciatica  Pain in left leg  Muscle weakness (generalized)  Other symptoms and signs involving the musculoskeletal system     Problem List Patient Active Problem List   Diagnosis Date Noted  . Encounter for gynecological examination with Papanicolaou smear of cervix 05/24/2017  . Hip pain,  acute, left 05/24/2017  . Hx of adenomatous colonic polyps 09/11/2013  . FHx: colon cancer 09/11/2013  . 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 [place], [place], [address] Phone: [phone]   Fax:  [phone]  Name: Tammy [name] MRN: [id number] Date of Birth: 1965-09-30

## 2017-07-31 ENCOUNTER — Encounter (HOSPITAL_COMMUNITY): Payer: BLUE CROSS/BLUE SHIELD

## 2017-08-02 ENCOUNTER — Ambulatory Visit (HOSPITAL_COMMUNITY): Payer: BLUE CROSS/BLUE SHIELD | Admitting: Physical Therapy

## 2017-08-02 DIAGNOSIS — M79605 Pain in left leg: Secondary | ICD-10-CM | POA: Diagnosis not present

## 2017-08-02 DIAGNOSIS — M6281 Muscle weakness (generalized): Secondary | ICD-10-CM

## 2017-08-02 DIAGNOSIS — M545 Low back pain: Principal | ICD-10-CM

## 2017-08-02 DIAGNOSIS — R29898 Other symptoms and signs involving the musculoskeletal system: Secondary | ICD-10-CM | POA: Diagnosis not present

## 2017-08-02 DIAGNOSIS — G8929 Other chronic pain: Secondary | ICD-10-CM

## 2017-08-02 NOTE — Therapy (Signed)
[facility] [address] [place], [place], [address] Phone: [phone]   Fax:  [phone]  Physical Therapy Treatment  Patient Details  Name: Tammy [name] MRN: [id number] Date of Birth: 09/23/1965 Referring Provider: Fuller CanadaStanley Harrison, MD   Encounter Date: 08/02/2017  PT End of Session - 08/02/17 0916    Visit Number  3    Number of Visits  9    Date for PT Re-Evaluation  08/22/17    Authorization Type  BCBS Other    Authorization Time Period  07/25/17 to 08/22/17    Authorization - Visit Number  3    Authorization - Number of Visits  60    PT Start Time  0816    PT Stop Time  0900    PT Time Calculation (min)  44 min    Activity Tolerance  Patient tolerated treatment well;No increased pain    Behavior During Therapy  WFL for tasks assessed/performed       Past Medical History:  Diagnosis Date  . Scoliosis   . 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 Thyroid disease     Past Surgical History:  Procedure Laterality Date  . COLONOSCOPY  08/25/2008   ONG:[id number]:Diminutive ascending colon polyp, status post cold biopsy removal/Remainder of colonic mucosa appeared normal/normal rectum. Tubular adenoma  . COLONOSCOPY N/A 09/26/2013   Procedure: COLONOSCOPY;  Surgeon: Corbin Adeobert M Rourk, MD;  [facility];  Service: Endoscopy;  Laterality: N/A;  12:15  . FOOT SURGERY Right   . thyroid removed  1987  . TUBAL LIGATION      There were no vitals filed for this visit.  Subjective Assessment - 08/02/17 0817    Subjective  Pt reports she didn't work too hard at work last night because the machine went down.  Worked 6 hours only. States she only has some stiffness, given a 1/10 pain.  STates she did some yard work after she left here last session and later when she was sitting eating dinner got a sharp pain Lt medial thigh down into great toe.  States it lasted about 10 seconds, went away and didn't come  back.    Currently in Pain?  Yes    Pain Score  1     Pain Location  Back    Pain Orientation  Lower;Left    Pain Descriptors / Indicators  Sore    Pain Type  Chronic pain                       [facility] Adult PT Treatment/Exercise - 08/02/17 0001      Lumbar Exercises: Stretches   Active Hamstring Stretch  2 reps;30 seconds    Active Hamstring Stretch Limitations  supine with hands behind thigh    Single Knee to Chest Stretch  2 reps;30 seconds;Right;Left    Piriformis Stretch  Right;Left;2 reps;30 seconds    Other Lumbar Stretch Exercise  FROG st for adductor/groin with TA contraction/ posterior pelvic tilt 1x 30"      Lumbar Exercises: Seated   Other Seated Lumbar Exercises  3D thoracic excursion with UE movements all 3 planes 5 reps each alternating      Lumbar Exercises: Supine   Ab Set  10 reps;3 seconds    AB Set Limitations  TA activation with tactile and verbal cueing    Clam  10 reps    Bridge  15 reps      Lumbar Exercises: Quadruped   Straight Leg Raise  10 reps      Manual Therapy   Manual Therapy  Manual Traction;Soft tissue mobilization    Manual therapy comments  Manual complete separate than rest of tx    Soft tissue mobilization  Soft tissue mobilization to Lt adductors and medial hamstring    Manual Traction  Lt hip distriction 3x 30"               PT Short Term Goals - 07/25/17 6962      PT SHORT TERM GOAL #1   Title  Pt will be independent with HEP and perform consisntely in order to decrease pain.    Time  2    Period  Weeks    Status  New    Target Date  08/08/17      PT SHORT TERM GOAL #2   Title  Pt will be able to perform bil SLS for 30 sec on BLE to demo improved functional BLE strength and maximize function at work.    Time  2    Period  Weeks    Status  New        PT Long Term Goals - 07/25/17 9528      PT LONG TERM GOAL #1   Title  Pt will have imrpoved bil proximal hip strength to decrease overall pain and  maximize function at work.     Time  4    Period  Weeks    Status  New    Target Date  08/22/17      PT LONG TERM GOAL #2   Title  Pt will have no pain with bilateral passive hip IR ROM, Thomas Test on the L, and passive hip abd on the L to demo improved L quad/hip flexor and L hip adductor flexibility in order to decrease overall pain.    Time  4    Period  Weeks    Status  New      PT LONG TERM GOAL #3   Title  Pt will report participating in her regular exercise routine at least 2-3x/week without L sided LBP or LLE pain in order to demo improved overall function and maximize pt's QOL.     Time  4    Period  Weeks    Status  New            Plan - 08/02/17 4132    Clinical Impression Statement  continued with core stab and LE strengthening/stretching.  Pt with descent core stabilization, only requiring min cues.  No "charlie horses" verbalized or noted during session today as pt had last session.  Added prone hip extensions and clams with core stab.  Manual completed at EOS revealed minimal tightness in Lt Lumbar region.  PT reported positive results from Lt LE traction.  No pain during or at end of session.     Rehab Potential  Good    PT Frequency  2x / week    PT Duration  4 weeks    PT Treatment/Interventions  ADLs/Self Care Home Management;Cryotherapy;Electrical Stimulation;Moist Heat;Traction;Ultrasound;Gait training;Stair training;Functional mobility training;Therapeutic activities;Therapeutic exercise;Balance training;Neuromuscular re-education;Patient/family education;Manual techniques;Passive range of motion;Dry needling;Energy conservation;Taping    PT Next Visit Plan  initiate functional BLE and core strenghtening; perform manual hip distraction for pain control, manual STM to L hip adductors, spinal joint mobs if with PT.  Next session begin functional strengthening including lunges, squats  and SLS for stabiltiy.     PT Home Exercise Plan  eval: supine frog stretch,  prone quad stretch       Patient will benefit from skilled therapeutic intervention in order to improve the following deficits and impairments:  Decreased balance, Decreased range of motion, Decreased strength, Hypomobility, Increased fascial restricitons, Increased muscle spasms, Impaired flexibility, Improper body mechanics, Postural dysfunction, Pain  Visit Diagnosis: Chronic left-sided low back pain without sciatica  Pain in left leg  Muscle weakness (generalized)  Other symptoms and signs involving the musculoskeletal system     Problem List Patient Active Problem List   Diagnosis Date Noted  . Encounter for gynecological examination with Papanicolaou smear of cervix 05/24/2017  . Hip pain, acute, left 05/24/2017  . Hx of adenomatous colonic polyps 09/11/2013  . FHx: colon cancer 09/11/2013  . 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

## 2017-08-04 ENCOUNTER — Encounter (HOSPITAL_COMMUNITY): Payer: Self-pay | Admitting: Physical Therapy

## 2017-08-04 ENCOUNTER — Ambulatory Visit (HOSPITAL_COMMUNITY): Payer: BLUE CROSS/BLUE SHIELD | Admitting: Physical Therapy

## 2017-08-04 DIAGNOSIS — R29898 Other symptoms and signs involving the musculoskeletal system: Secondary | ICD-10-CM | POA: Diagnosis not present

## 2017-08-04 DIAGNOSIS — M6281 Muscle weakness (generalized): Secondary | ICD-10-CM

## 2017-08-04 DIAGNOSIS — M79605 Pain in left leg: Secondary | ICD-10-CM | POA: Diagnosis not present

## 2017-08-04 DIAGNOSIS — M545 Low back pain, unspecified: Secondary | ICD-10-CM

## 2017-08-04 DIAGNOSIS — G8929 Other chronic pain: Secondary | ICD-10-CM | POA: Diagnosis not present

## 2017-08-04 NOTE — Therapy (Signed)
[facility] [address] [place], [place], [address] Phone: [phone]   Fax:  [phone]  Physical Therapy Treatment  Patient Details  Name: Tammy [name] MRN: [id number] Date of Birth: 1965-11-20 Referring Provider: Fuller Canada, MD   Encounter Date: 08/04/2017  PT End of Session - 08/04/17 1012    Visit Number  4    Number of Visits  9    Date for PT Re-Evaluation  08/22/17    Authorization Type  BCBS Other    Authorization Time Period  07/25/17 to 08/22/17    Authorization - Visit Number  4    Authorization - Number of Visits  60    PT Start Time  0905    PT Stop Time  0950    PT Time Calculation (min)  45 min    Activity Tolerance  Patient tolerated treatment well;No increased pain    Behavior During Therapy  WFL for tasks assessed/performed       Past Medical History:  Diagnosis Date  . Scoliosis   . 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 Thyroid disease     Past Surgical History:  Procedure Laterality Date  . COLONOSCOPY  08/25/2008   ONG:EXBMWUXLKG ascending colon polyp, status post cold biopsy removal/Remainder of colonic mucosa appeared normal/normal rectum. Tubular adenoma  . COLONOSCOPY N/A 09/26/2013   Procedure: COLONOSCOPY;  Surgeon: Corbin Ade, MD;  [facility];  Service: Endoscopy;  Laterality: N/A;  12:15  . FOOT SURGERY Right   . thyroid removed  1987  . TUBAL LIGATION      There were no vitals filed for this visit.  Subjective Assessment - 08/04/17 0908    Subjective  Patient stated that she was working last night for 12 hours. She stated as she was working her feet began to ache and she had some pain in left thigh.     Currently in Pain?  Yes    Pain Score  3     Pain Location  Leg    Pain Orientation  Left    Pain Descriptors / Indicators  Sharp    Pain Type  Chronic pain    Pain Onset  More than a month ago    Aggravating Factors    Standing, working    Pain Relieving Factors  TENS, ice/heat    Effect of Pain on Daily Activities  Working through it                       [facility] Adult PT Treatment/Exercise - 08/04/17 0001      Lumbar Exercises: Stretches   Active Hamstring Stretch  2 reps;30 seconds    Active Hamstring Stretch Limitations  supine with hands behind thigh    Single Knee to Chest Stretch  2 reps;30 seconds;Right;Left    Piriformis Stretch  Right;Left;2 reps;30 seconds      Lumbar Exercises: Standing   Functional Squats  10 reps;Other (comment) Tapping back onto chair. Cues for form.     Forward Lunge  15 reps;Other (comment) onto 4 inch step      Lumbar Exercises: Supine   Ab Set  10 reps;3 seconds    AB Set Limitations  TA activation with tactile and verbal cueing    Clam  10 reps red theraband    Bridge  15 reps 3 seconds lifting 3 second holds and 3 seconds lowering  Manual Therapy   Manual Therapy  Manual Traction;Soft tissue mobilization    Manual therapy comments  Manual complete separate than rest of tx    Soft tissue mobilization  Patient supine. Soft tissue mobilization to Lt adductors and medial hamstring    Manual Traction  Lt manual hip distraction 3x 30"             PT Education - 08/04/17 0912    Education provided  Yes    Education Details  Patient educated on purpose and technique of exercises throughout session and educated on anatomy on back pain.     Person(s) Educated  Patient    Methods  Explanation;Demonstration    Comprehension  Verbalized understanding;Returned demonstration       PT Short Term Goals - 07/25/17 0923      PT SHORT TERM GOAL #1   Title  Pt will be independent with HEP and perform consisntely in order to decrease pain.    Time  2    Period  Weeks    Status  New    Target Date  08/08/17      PT SHORT TERM GOAL #2   Title  Pt will be able to perform bil SLS for 30 sec on BLE to demo improved functional BLE strength and  maximize function at work.    Time  2    Period  Weeks    Status  New        PT Long Term Goals - 07/25/17 [id number]      PT LONG TERM GOAL #1   Title  Pt will have imrpoved bil proximal hip strength to decrease overall pain and maximize function at work.     Time  4    Period  Weeks    Status  New    Target Date  08/22/17      PT LONG TERM GOAL #2   Title  Pt will have no pain with bilateral passive hip IR ROM, Thomas Test on the L, and passive hip abd on the L to demo improved L quad/hip flexor and L hip adductor flexibility in order to decrease overall pain.    Time  4    Period  Weeks    Status  New      PT LONG TERM GOAL #3   Title  Pt will report participating in her regular exercise routine at least 2-3x/week without L sided LBP or LLE pain in order to demo improved overall function and maximize pt's QOL.     Time  4    Period  Weeks    Status  New            Plan - 08/04/17 1013    Clinical Impression Statement  This session began with patient performing stretches followed by strengthening exercises on the mat table. Then session progressed to patient performing standing functional lower extremity strengthening exercises. This session added standing lunges on 4 inch step which therapist provided verbal cues, tactile cues and demonstration for in order to correct patient's form. Also added squats this session with verbal cues to improve patient's form. Ended session with manual therapy with soft tissue mobilization and manual traction performed to patient's tolerance. Patient reported a decrease in pain to a 2/10 following manual therapy.     Rehab Potential  Good    PT Frequency  2x / week    PT Duration  4 weeks    PT Treatment/Interventions  ADLs/Self Care  Home Management;Cryotherapy;Electrical Stimulation;Moist Heat;Traction;Ultrasound;Gait training;Stair training;Functional mobility training;Therapeutic activities;Therapeutic exercise;Balance training;Neuromuscular  re-education;Patient/family education;Manual techniques;Passive range of motion;Dry needling;Energy conservation;Taping    PT Next Visit Plan  Continue functional BLE and core strenghtening; perform manual hip distraction for pain control, manual STM to L hip adductors, spinal joint mobs if with PT.  Continue functional strengthening including lunges, and squats. Initiate SLS for stabiltiy.     PT Home Exercise Plan  eval: supine frog stretch, prone quad stretch       Patient will benefit from skilled therapeutic intervention in order to improve the following deficits and impairments:  Decreased balance, Decreased range of motion, Decreased strength, Hypomobility, Increased fascial restricitons, Increased muscle spasms, Impaired flexibility, Improper body mechanics, Postural dysfunction, Pain  Visit Diagnosis: Chronic left-sided low back pain without sciatica  Pain in left leg  Muscle weakness (generalized)  Other symptoms and signs involving the musculoskeletal system     Problem List Patient Active Problem List   Diagnosis Date Noted  . Encounter for gynecological examination with Papanicolaou smear of cervix 05/24/2017  . Hip pain, acute, left 05/24/2017  . Hx of adenomatous colonic polyps 09/11/2013  . FHx: colon cancer 09/11/2013  . 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 [place], [place], [address] Phone: [phone]   Fax:  [phone]  Name: DEWANNA HURSTON MRN: [id number] Date of Birth: [birth year]

## 2017-08-08 ENCOUNTER — Ambulatory Visit (HOSPITAL_COMMUNITY): Payer: BLUE CROSS/BLUE SHIELD | Admitting: Physical Therapy

## 2017-08-08 DIAGNOSIS — M79605 Pain in left leg: Secondary | ICD-10-CM | POA: Diagnosis not present

## 2017-08-08 DIAGNOSIS — G8929 Other chronic pain: Secondary | ICD-10-CM | POA: Diagnosis not present

## 2017-08-08 DIAGNOSIS — M6281 Muscle weakness (generalized): Secondary | ICD-10-CM | POA: Diagnosis not present

## 2017-08-08 DIAGNOSIS — M545 Low back pain: Principal | ICD-10-CM

## 2017-08-08 DIAGNOSIS — R29898 Other symptoms and signs involving the musculoskeletal system: Secondary | ICD-10-CM | POA: Diagnosis not present

## 2017-08-08 NOTE — Therapy (Signed)
[facility] [address] [place], [place], [address] Phone: [phone]   Fax:  [phone]  Physical Therapy Treatment  Patient Details  Name: Tammy [name] MRN: [id number] Date of Birth: 1965/12/09 Referring Provider: Fuller Canada, MD   Encounter Date: 08/08/2017  PT End of Session - 08/08/17 0906    Visit Number  5    Number of Visits  9    Date for PT Re-Evaluation  08/22/17    Authorization Type  BC[employer] Other    Authorization Time Period  07/25/17 to 08/22/17    Authorization - Visit Number  5    Authorization - Number of Visits  60    PT Start Time  0818    PT Stop Time  0904    PT Time Calculation (min)  46 min    Activity Tolerance  Patient tolerated treatment well;No increased pain    Behavior During Therapy  WFL for tasks assessed/performed       Past Medical History:  Diagnosis Date  . Scoliosis   . 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 Thyroid disease     Past Surgical History:  Procedure Laterality Date  . COLONOSCOPY  08/25/2008   XLK:[id number] ascending colon polyp, status post cold biopsy removal/Remainder of colonic mucosa appeared normal/normal rectum. Tubular adenoma  . COLONOSCOPY N/A 09/26/2013   Procedure: COLONOSCOPY;  Surgeon: Corbin Ade, MD;  [facility];  Service: Endoscopy;  Laterality: N/A;  12:15  . FOOT SURGERY Right   . thyroid removed  1987  . TUBAL LIGATION      There were no vitals filed for this visit.  Subjective Assessment - 08/08/17 0824    Subjective  PT reports having about 2/10 pain in Lt thigh after working last night.      Currently in Pain?  Yes    Pain Score  2     Pain Location  Leg    Pain Orientation  Left;Medial    Pain Descriptors / Indicators  [facility] Adult PT Treatment/Exercise - 08/08/17 0001      Lumbar Exercises: Stretches   Active Hamstring Stretch  2 reps;30  seconds    Active Hamstring Stretch Limitations  supine with hands behind thigh    Single Knee to Chest Stretch  2 reps;30 seconds;Right;Left      Lumbar Exercises: Standing   Functional Squats  15 reps    Forward Lunge  15 reps;Other (comment)    Other Standing Lumbar Exercises  tandem 30" each without difficulty, SLS 30" no UE each without difficulty    Other Standing Lumbar Exercises  lumbar excursions 10 reps each       Lumbar Exercises: Seated   Other Seated Lumbar Exercises  3D thoracic excursion with UE movements all 3 planes 5 reps each alternating      Lumbar Exercises: Supine   Ab Set  10 reps;3 seconds    Clam  15 reps;Limitations    Clam Limitations  with RTB    Bridge  15 reps      Lumbar Exercises: Sidelying   Hip Abduction  Both;15 reps      Manual Therapy   Manual Therapy  Manual Traction;Soft tissue mobilization    Manual therapy comments  Manual complete separate than rest of  tx    Soft tissue mobilization  Patient supine. Soft tissue mobilization to Lt adductors and medial hamstring    Manual Traction  Lt manual hip distraction 3x 30"               PT Short Term Goals - 07/25/17 4034      PT SHORT TERM GOAL #1   Title  Pt will be independent with HEP and perform consisntely in order to decrease pain.    Time  2    Period  Weeks    Status  New    Target Date  08/08/17      PT SHORT TERM GOAL #2   Title  Pt will be able to perform bil SLS for 30 sec on BLE to demo improved functional BLE strength and maximize function at work.    Time  2    Period  Weeks    Status  New        PT Long Term Goals - 07/25/17 7425      PT LONG TERM GOAL #1   Title  Pt will have imrpoved bil proximal hip strength to decrease overall pain and maximize function at work.     Time  4    Period  Weeks    Status  New    Target Date  08/22/17      PT LONG TERM GOAL #2   Title  Pt will have no pain with bilateral passive hip IR ROM, Thomas Test on the L, and  passive hip abd on the L to demo improved L quad/hip flexor and L hip adductor flexibility in order to decrease overall pain.    Time  4    Period  Weeks    Status  New      PT LONG TERM GOAL #3   Title  Pt will report participating in her regular exercise routine at least 2-3x/week without L sided LBP or LLE pain in order to demo improved overall function and maximize pt's QOL.     Time  4    Period  Weeks    Status  New            Plan - 08/08/17 1028    Clinical Impression Statement  Contineud with stretching and strengthening therex to decrease deficits.  Improved form with lunges, only needing cues for squats and excursions.  Added lumbar excursions with noted reduction in mobility.  Completed balance activites without difificulty and ease of completing 30 second holds without LOB.  Reduction of pain noted at EOS following manual .    Rehab Potential  Good    PT Frequency  2x / week    PT Duration  4 weeks    PT Treatment/Interventions  ADLs/Self Care Home Management;Cryotherapy;Electrical Stimulation;Moist Heat;Traction;Ultrasound;Gait training;Stair training;Functional mobility training;Therapeutic activities;Therapeutic exercise;Balance training;Neuromuscular re-education;Patient/family education;Manual techniques;Passive range of motion;Dry needling;Energy conservation;Taping    PT Next Visit Plan  Continue functional BLE and core strenghtening; perform manual hip distraction for pain control, manual STM to L hip adductors, spinal joint mobs if with PT.  Progress functional strengthening.     PT Home Exercise Plan  eval: supine frog stretch, prone quad stretch       Patient will benefit from skilled therapeutic intervention in order to improve the following deficits and impairments:  Decreased balance, Decreased range of motion, Decreased strength, Hypomobility, Increased fascial restricitons, Increased muscle spasms, Impaired flexibility, Improper body mechanics, Postural  dysfunction, Pain  Visit Diagnosis: Chronic  left-sided low back pain without sciatica  Pain in left leg  Muscle weakness (generalized)  Other symptoms and signs involving the musculoskeletal system     Problem List Patient Active Problem List   Diagnosis Date Noted  . Encounter for gynecological examination with Papanicolaou smear of cervix 05/24/2017  . Hip pain, acute, left 05/24/2017  . Hx of adenomatous colonic polyps 09/11/2013  . FHx: colon cancer 09/11/2013  . 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 [place], [place], [address] Phone: [phone]   Fax:  [phone]  Name: KEAYRA GRAHAM MRN: [id number] Date of Birth: [birth year]

## 2017-08-10 ENCOUNTER — Encounter (HOSPITAL_COMMUNITY): Payer: Self-pay

## 2017-08-10 ENCOUNTER — Ambulatory Visit (HOSPITAL_COMMUNITY): Payer: BLUE CROSS/BLUE SHIELD | Attending: Orthopedic Surgery

## 2017-08-10 DIAGNOSIS — M545 Low back pain, unspecified: Secondary | ICD-10-CM

## 2017-08-10 DIAGNOSIS — M79605 Pain in left leg: Secondary | ICD-10-CM | POA: Insufficient documentation

## 2017-08-10 DIAGNOSIS — G8929 Other chronic pain: Secondary | ICD-10-CM | POA: Diagnosis not present

## 2017-08-10 DIAGNOSIS — R29898 Other symptoms and signs involving the musculoskeletal system: Secondary | ICD-10-CM | POA: Diagnosis not present

## 2017-08-10 DIAGNOSIS — M6281 Muscle weakness (generalized): Secondary | ICD-10-CM | POA: Insufficient documentation

## 2017-08-10 NOTE — Therapy (Signed)
[place] [facility] [address] [place], [place], [address] Phone: [phone]   Fax:  [phone]  Physical Therapy Treatment  Patient Details  Name: Tammy [name] MRN: [id number] Date of Birth: [birth year] Referring Provider: Fuller Canada, MD   Encounter Date: 08/10/2017  PT End of Session - 08/10/17 0820    Visit Number  6    Number of Visits  9    Date for PT Re-Evaluation  08/22/17    Authorization Type  BCBS Other    Authorization Time Period  07/25/17 to 08/22/17    Authorization - Visit Number  6    Authorization - Number of Visits  60    PT Start Time  0816    PT Stop Time  0859    PT Time Calculation (min)  43 min    Activity Tolerance  Patient tolerated treatment well;No increased pain    Behavior During Therapy  WFL for tasks assessed/performed       Past Medical History:  Diagnosis Date  . Scoliosis   . 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 Thyroid disease     Past Surgical History:  Procedure Laterality Date  . COLONOSCOPY  08/25/2008   ONG:[id number] ascending colon polyp, status post cold biopsy removal/Remainder of colonic mucosa appeared normal/normal rectum. Tubular adenoma  . COLONOSCOPY N/A 09/26/2013   Procedure: COLONOSCOPY;  Surgeon: Corbin Ade, MD;  [facility];  Service: Endoscopy;  Laterality: N/A;  12:15  . FOOT SURGERY Right   . thyroid removed  1987  . TUBAL LIGATION      There were no vitals filed for this visit.  Subjective Assessment - 08/10/17 0818    Subjective  Pt stated she has some achey soreness, she worked 6 hours last night.  Pt stated she feels like she uses her Lt LE more than Rt    Patient Stated Goals  no pain    Currently in Pain?  Yes    Pain Score  1     Pain Location  Back    Pain Orientation  Lower;Left    Pain Descriptors / Indicators  Aching;Sore    Pain Type  Chronic pain    Pain Onset  More than a month ago    Pain  Frequency  Constant    Aggravating Factors   standing, working    Pain Relieving Factors  TENS, ice/heat    Effect of Pain on Daily Activities  working through it                       [facility] Adult PT Treatment/Exercise - 08/10/17 0001      Lumbar Exercises: Stretches   Other Lumbar Stretch Exercise  Child's pose 2x 30"      Lumbar Exercises: Standing   Heel Raises  20 reps    Functional Squats  15 reps    Forward Lunge  15 reps;Other (comment)    Other Standing Lumbar Exercises  rockerboard 2' lateral; SLS Rt 49",Lt 60" first attempt; vector stance 3x 5"    Other Standing Lumbar Exercises  3D hip excursion all directions 10x each      Lumbar Exercises: Seated   Other Seated Lumbar Exercises  3D thoracic excursion with UE movements all 3 planes 5 reps each alternating      Manual Therapy   Manual Therapy  Manual Traction;Soft tissue mobilization  Manual therapy comments  Manual complete separate than rest of tx    Soft tissue mobilization  Patient supine. Soft tissue mobilization to Lt adductors and medial hamstring    Manual Traction  Lt manual hip distraction 3x 30"               PT Short Term Goals - 07/25/17 0981      PT SHORT TERM GOAL #1   Title  Pt will be independent with HEP and perform consisntely in order to decrease pain.    Time  2    Period  Weeks    Status  New    Target Date  08/08/17      PT SHORT TERM GOAL #2   Title  Pt will be able to perform bil SLS for 30 sec on BLE to demo improved functional BLE strength and maximize function at work.    Time  2    Period  Weeks    Status  New        PT Long Term Goals - 07/25/17 1914      PT LONG TERM GOAL #1   Title  Pt will have imrpoved bil proximal hip strength to decrease overall pain and maximize function at work.     Time  4    Period  Weeks    Status  New    Target Date  08/22/17      PT LONG TERM GOAL #2   Title  Pt will have no pain with bilateral passive hip IR ROM,  Thomas Test on the L, and passive hip abd on the L to demo improved L quad/hip flexor and L hip adductor flexibility in order to decrease overall pain.    Time  4    Period  Weeks    Status  New      PT LONG TERM GOAL #3   Title  Pt will report participating in her regular exercise routine at least 2-3x/week without L sided LBP or LLE pain in order to demo improved overall function and maximize pt's QOL.     Time  4    Period  Weeks    Status  New            Plan - 08/10/17 7829    Clinical Impression Statement  Continued session focus with stretching and LE strengthening.  Pt presents wtih proper form with squats and reports of relief with thoracic and hip excursions this session.  Reviewed compliance wiht HEP with additional exercises given to improve mobility and gluteal strengthening.  Pt improved SLS, progressed to vector stance for stability.  EOS with manual to address soft tissue restrictions with reports of relief following.      Rehab Potential  Good    PT Frequency  2x / week    PT Duration  4 weeks    PT Treatment/Interventions  ADLs/Self Care Home Management;Cryotherapy;Electrical Stimulation;Moist Heat;Traction;Ultrasound;Gait training;Stair training;Functional mobility training;Therapeutic activities;Therapeutic exercise;Balance training;Neuromuscular re-education;Patient/family education;Manual techniques;Passive range of motion;Dry needling;Energy conservation;Taping    PT Next Visit Plan  Continue functional BLE and core strenghtening; perform manual hip distraction for pain control, manual STM to L hip adductors, spinal joint mobs if with PT.  Progress functional strengthening.     PT Home Exercise Plan  eval: supine frog stretch, prone quad stretch; 08/10/17: 3D thoracic and hip excursion       Patient will benefit from skilled therapeutic intervention in order to improve the following deficits and impairments:  Decreased balance, Decreased range of motion, Decreased  strength, Hypomobility, Increased fascial restricitons, Increased muscle spasms, Impaired flexibility, Improper body mechanics, Postural dysfunction, Pain  Visit Diagnosis: Chronic left-sided low back pain without sciatica  Pain in left leg  Muscle weakness (generalized)  Other symptoms and signs involving the musculoskeletal system     Problem List Patient Active Problem List   Diagnosis Date Noted  . Encounter for gynecological examination with Papanicolaou smear of cervix 05/24/2017  . Hip pain, acute, left 05/24/2017  . Hx of adenomatous colonic polyps 09/11/2013  . FHx: colon cancer 09/11/2013  . 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

## 2017-08-14 ENCOUNTER — Encounter (HOSPITAL_COMMUNITY): Payer: Self-pay

## 2017-08-14 ENCOUNTER — Ambulatory Visit (HOSPITAL_COMMUNITY): Payer: BLUE CROSS/BLUE SHIELD

## 2017-08-14 DIAGNOSIS — M6281 Muscle weakness (generalized): Secondary | ICD-10-CM

## 2017-08-14 DIAGNOSIS — M79605 Pain in left leg: Secondary | ICD-10-CM

## 2017-08-14 DIAGNOSIS — M545 Low back pain, unspecified: Secondary | ICD-10-CM

## 2017-08-14 DIAGNOSIS — G8929 Other chronic pain: Secondary | ICD-10-CM

## 2017-08-14 DIAGNOSIS — R29898 Other symptoms and signs involving the musculoskeletal system: Secondary | ICD-10-CM | POA: Diagnosis not present

## 2017-08-14 NOTE — Therapy (Signed)
[facility] [address] [place], [place], [address] Phone: [phone]   Fax:  [phone]  Physical Therapy Treatment  Patient Details  Name: Tammy [name] MRN: [id number] Date of Birth: 1965-05-08 Referring Provider: Fuller Canada, MD   Encounter Date: 08/14/2017  PT End of Session - 08/14/17 0901    Visit Number  7    Number of Visits  9    Date for PT Re-Evaluation  08/22/17    Authorization Type  BCBS Other    Authorization Time Period  07/25/17 to 08/22/17    Authorization - Visit Number  7    Authorization - Number of Visits  60    PT Start Time  0901    PT Stop Time  0943    PT Time Calculation (min)  42 min    Activity Tolerance  Patient tolerated treatment well;No increased pain    Behavior During Therapy  WFL for tasks assessed/performed       Past Medical History:  Diagnosis Date  . Scoliosis   . 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 Thyroid disease     Past Surgical History:  Procedure Laterality Date  . COLONOSCOPY  08/25/2008   LOV:FIEPPIRJJO ascending colon polyp, status post cold biopsy removal/Remainder of colonic mucosa appeared normal/normal rectum. Tubular adenoma  . COLONOSCOPY N/A 09/26/2013   Procedure: COLONOSCOPY;  Surgeon: Corbin Ade, MD;  [facility];  Service: Endoscopy;  Laterality: N/A;  12:15  . FOOT SURGERY Right   . thyroid removed  1987  . TUBAL LIGATION      There were no vitals filed for this visit.  Subjective Assessment - 08/14/17 0901    Subjective  Pt states that she is tired from her shift last night. She states that her back pain is feeling better; she feels like therapy is helping and the latest HEP additions helped.    Patient Stated Goals  no pain    Currently in Pain?  No/denies    Pain Onset  More than a month ago           [facility] Adult PT Treatment/Exercise - 08/14/17 0001      Lumbar Exercises: Stretches   Hip  Flexor Stretch  Right;Left;2 reps;30 seconds;Limitations    Hip Flexor Stretch Limitations  knee on mat table +pelvic tuck/hip extension     Other Lumbar Stretch Exercise  fwd/lat child's pose 2x30" each      Lumbar Exercises: Aerobic   Stationary Bike         Lumbar Exercises: Supine   Single Leg Bridge  15 reps;Limitations    Bridge with Ball Squeeze Limitations  BLE    Straight Leg Raise  15 reps;Limitations    Straight Leg Raises Limitations  BLE      Lumbar Exercises: Sidelying   Other Sidelying Lumbar Exercises  side planks 10x5" holds each    Other Sidelying Lumbar Exercises  sidelying thoracic rotations 5x10" holds each with hips 90/90      Manual Therapy   Manual Therapy  Joint mobilization;Myofascial release    Manual therapy comments  Manual complete separate than rest of tx    Joint Mobilization  Grade III-IV central PAs to thoracic and lumbar joint mobs, T4-L5    Myofascial Release  L hip flexor with pt in supine            PT Short Term Goals -  07/25/17 0923      PT SHORT TERM GOAL #1   Title  Pt will be independent with HEP and perform consisntely in order to decrease pain.    Time  2    Period  Weeks    Status  New    Target Date  08/08/17      PT SHORT TERM GOAL #2   Title  Pt will be able to perform bil SLS for 30 sec on BLE to demo improved functional BLE strength and maximize function at work.    Time  2    Period  Weeks    Status  New        PT Long Term Goals - 07/25/17 2956      PT LONG TERM GOAL #1   Title  Pt will have imrpoved bil proximal hip strength to decrease overall pain and maximize function at work.     Time  4    Period  Weeks    Status  New    Target Date  08/22/17      PT LONG TERM GOAL #2   Title  Pt will have no pain with bilateral passive hip IR ROM, Thomas Test on the L, and passive hip abd on the L to demo improved L quad/hip flexor and L hip adductor flexibility in order to decrease overall pain.    Time  4     Period  Weeks    Status  New      PT LONG TERM GOAL #3   Title  Pt will report participating in her regular exercise routine at least 2-3x/week without L sided LBP or LLE pain in order to demo improved overall function and maximize pt's QOL.     Time  4    Period  Weeks    Status  New            Plan - 08/14/17 0944    Clinical Impression Statement  Today's session continued to focus on hip and thoracic mobility along with hip and core strengthening. Pt tolerating all therex well, not reporting any pain during session. Resumed sidelying thoracic rotations and added hip flexor stretching in standing; she had increased difficulty/more tightness with R sidelying thoracic rotation and L hip flexor stretching. PT progressed hip and core strengthening this date, which pt required min cues for proper technique throughout. Ended with manual to address soft tissue restrictions and spinal joint hypomobility; she stated that central PAs to L2-4 referred minimal pain into her L hip with no change in prolonged bout. No pain reported at EOS. Continue as planned, progressing as able.     Rehab Potential  Good    PT Frequency  2x / week    PT Duration  4 weeks    PT Treatment/Interventions  ADLs/Self Care Home Management;Cryotherapy;Electrical Stimulation;Moist Heat;Traction;Ultrasound;Gait training;Stair training;Functional mobility training;Therapeutic activities;Therapeutic exercise;Balance training;Neuromuscular re-education;Patient/family education;Manual techniques;Passive range of motion;Dry needling;Energy conservation;Taping    PT Next Visit Plan  Continue functional BLE and core strenghtening; perform manual hip distraction for pain control, manual STM to L hip adductors, continue spinal joint mobs if with PT.  Progress functional strengthening.     PT Home Exercise Plan  eval: supine frog stretch, prone quad stretch; 08/10/17: 3D thoracic and hip excursion; 5/6: standing hip flexor stretch, single  leg bridges, SLR    Consulted and Agree with Plan of Care  Patient       Patient will benefit from skilled therapeutic intervention  in order to improve the following deficits and impairments:  Decreased balance, Decreased range of motion, Decreased strength, Hypomobility, Increased fascial restricitons, Increased muscle spasms, Impaired flexibility, Improper body mechanics, Postural dysfunction, Pain  Visit Diagnosis: Chronic left-sided low back pain without sciatica  Pain in left leg  Muscle weakness (generalized)  Other symptoms and signs involving the musculoskeletal system     Problem List Patient Active Problem List   Diagnosis Date Noted  . Encounter for gynecological examination with Papanicolaou smear of cervix 05/24/2017  . Hip pain, acute, left 05/24/2017  . Hx of adenomatous colonic polyps 09/11/2013  . FHx: colon cancer 09/11/2013  . 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

## 2017-08-16 ENCOUNTER — Ambulatory Visit (HOSPITAL_COMMUNITY): Payer: BLUE CROSS/BLUE SHIELD

## 2017-08-16 ENCOUNTER — Encounter (HOSPITAL_COMMUNITY): Payer: Self-pay

## 2017-08-16 DIAGNOSIS — M79605 Pain in left leg: Secondary | ICD-10-CM | POA: Diagnosis not present

## 2017-08-16 DIAGNOSIS — R29898 Other symptoms and signs involving the musculoskeletal system: Secondary | ICD-10-CM | POA: Diagnosis not present

## 2017-08-16 DIAGNOSIS — M545 Low back pain, unspecified: Secondary | ICD-10-CM

## 2017-08-16 DIAGNOSIS — G8929 Other chronic pain: Secondary | ICD-10-CM

## 2017-08-16 DIAGNOSIS — M6281 Muscle weakness (generalized): Secondary | ICD-10-CM

## 2017-08-16 NOTE — Therapy (Signed)
[facility] [address] [place], [place], [address] Phone: [phone]   Fax:  [phone]  Physical Therapy Treatment  Patient Details  Name: Tammy [name] MRN: [id number] Date of Birth: 11/11/1965 Referring Provider: Fuller Canada, MD   Encounter Date: 08/16/2017  PT End of Session - 08/16/17 0823    Visit Number  8    Number of Visits  9    Date for PT Re-Evaluation  08/22/17    Authorization Type  BCBS Other    Authorization Time Period  07/25/17 to 08/22/17    Authorization - Visit Number  8    Authorization - Number of Visits  60    PT Start Time  0816    PT Stop Time  0858    PT Time Calculation (min)  42 min    Activity Tolerance  Patient tolerated treatment well;No increased pain    Behavior During Therapy  WFL for tasks assessed/performed       Past Medical History:  Diagnosis Date  . Scoliosis   . 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 Thyroid disease     Past Surgical History:  Procedure Laterality Date  . COLONOSCOPY  08/25/2008   ONG:EXBMWUXLKG ascending colon polyp, status post cold biopsy removal/Remainder of colonic mucosa appeared normal/normal rectum. Tubular adenoma  . COLONOSCOPY N/A 09/26/2013   Procedure: COLONOSCOPY;  Surgeon: Corbin Ade, MD;  [facility];  Service: Endoscopy;  Laterality: N/A;  12:15  . FOOT SURGERY Right   . thyroid removed  1987  . TUBAL LIGATION      There were no vitals filed for this visit.  Subjective Assessment - 08/16/17 0816    Subjective  Pt stated she is tired from working last night.  Stated she is feeling better, has been compliant with HEP daily and reports the exercises are helpful.      Pertinent History  scoliosis    Patient Stated Goals  no pain    Currently in Pain?  No/denies                       [facility] Adult PT Treatment/Exercise - 08/16/17 0001      Lumbar Exercises: Stretches   Lower  Trunk Rotation  5 reps;10 seconds    Other Lumbar Stretch Exercise  fwd/lat child's pose 2x30" each      Lumbar Exercises: Standing   Functional Squats  5 reps;Limitations;20 reps    Functional Squats Limitations  2 squats then walk around step for IR      Lumbar Exercises: Supine   Single Leg Bridge  15 reps;Limitations    Bridge with Ball Squeeze Limitations  BLE    Straight Leg Raise  15 reps;Limitations    Straight Leg Raises Limitations  floating SLR BLE      Lumbar Exercises: Sidelying   Other Sidelying Lumbar Exercises  side planks 5x 10" holds each, last plank 15" holds     Other Sidelying Lumbar Exercises  sidelying thoracic rotations 5x10" holds each with hips 90/90      Manual Therapy   Manual Therapy  Soft tissue mobilization    Manual therapy comments  Manual complete separate than rest of tx    Soft tissue mobilization  Pt supine then sidelying with focus on hip flexor, adductor and Lt glut    Myofascial Release  L hip flexor with pt in  supine               PT Short Term Goals - 07/25/17 0981      PT SHORT TERM GOAL #1   Title  Pt will be independent with HEP and perform consisntely in order to decrease pain.    Time  2    Period  Weeks    Status  New    Target Date  08/08/17      PT SHORT TERM GOAL #2   Title  Pt will be able to perform bil SLS for 30 sec on BLE to demo improved functional BLE strength and maximize function at work.    Time  2    Period  Weeks    Status  New        PT Long Term Goals - 07/25/17 1914      PT LONG TERM GOAL #1   Title  Pt will have imrpoved bil proximal hip strength to decrease overall pain and maximize function at work.     Time  4    Period  Weeks    Status  New    Target Date  08/22/17      PT LONG TERM GOAL #2   Title  Pt will have no pain with bilateral passive hip IR ROM, Thomas Test on the L, and passive hip abd on the L to demo improved L quad/hip flexor and L hip adductor flexibility in order to  decrease overall pain.    Time  4    Period  Weeks    Status  New      PT LONG TERM GOAL #3   Title  Pt will report participating in her regular exercise routine at least 2-3x/week without L sided LBP or LLE pain in order to demo improved overall function and maximize pt's QOL.     Time  4    Period  Weeks    Status  New            Plan - 08/16/17 7829    Clinical Impression Statement  Session focus with spinal mobiltiy and hip/core strengthening.  Pt progressing well with reports of compliance with HEP and no pain.  Improved form noted today with sideplanks and able to demonstrate appropriate mechanics with squats.  Added squat walk around to improve IR and LTR to address hip restrictions.  EOS with manual to address soft tissue restricitons on Lt hip flexor and gluteal region, noted improvements in adductor region this session.  No reports of pain through session.      Rehab Potential  Good    PT Frequency  2x / week    PT Duration  4 weeks    PT Treatment/Interventions  ADLs/Self Care Home Management;Cryotherapy;Electrical Stimulation;Moist Heat;Traction;Ultrasound;Gait training;Stair training;Functional mobility training;Therapeutic activities;Therapeutic exercise;Balance training;Neuromuscular re-education;Patient/family education;Manual techniques;Passive range of motion;Dry needling;Energy conservation;Taping    PT Next Visit Plan  Reassess next session.      PT Home Exercise Plan  eval: supine frog stretch, prone quad stretch; 08/10/17: 3D thoracic and hip excursion; 5/6: standing hip flexor stretch, single leg bridges, SLR       Patient will benefit from skilled therapeutic intervention in order to improve the following deficits and impairments:  Decreased balance, Decreased range of motion, Decreased strength, Hypomobility, Increased fascial restricitons, Increased muscle spasms, Impaired flexibility, Improper body mechanics, Postural dysfunction, Pain  Visit  Diagnosis: Chronic left-sided low back pain without sciatica  Pain in left leg  Muscle  weakness (generalized)  Other symptoms and signs involving the musculoskeletal system     Problem List Patient Active Problem List   Diagnosis Date Noted  . Encounter for gynecological examination with Papanicolaou smear of cervix 05/24/2017  . Hip pain, acute, left 05/24/2017  . Hx of adenomatous colonic polyps 09/11/2013  . FHx: colon cancer 09/11/2013  . 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 [address] [place], [place], [address] Phone: [phone]   Fax:  [phone]  Name: Tammy [name] MRN: [id number] Date of Birth: 06/28/1965

## 2017-08-18 ENCOUNTER — Encounter (HOSPITAL_COMMUNITY): Payer: BLUE CROSS/BLUE SHIELD

## 2017-08-21 ENCOUNTER — Ambulatory Visit (HOSPITAL_COMMUNITY): Payer: BLUE CROSS/BLUE SHIELD

## 2017-08-21 DIAGNOSIS — M545 Low back pain, unspecified: Secondary | ICD-10-CM

## 2017-08-21 DIAGNOSIS — G8929 Other chronic pain: Secondary | ICD-10-CM | POA: Diagnosis not present

## 2017-08-21 DIAGNOSIS — M6281 Muscle weakness (generalized): Secondary | ICD-10-CM

## 2017-08-21 DIAGNOSIS — M79605 Pain in left leg: Secondary | ICD-10-CM | POA: Diagnosis not present

## 2017-08-21 DIAGNOSIS — R29898 Other symptoms and signs involving the musculoskeletal system: Secondary | ICD-10-CM | POA: Diagnosis not present

## 2017-08-21 NOTE — Therapy (Signed)
[place], [place], [address] Phone: [phone]   Fax:  [phone]  Physical Therapy Treatment/Discharge Summary  Patient Details  Name: Tammy [name] MRN: [id number] Date of Birth: [birth year] Referring Provider: Arther Abbott, MD   Encounter Date: 08/21/2017  PT End of Session - 08/21/17 0901    Visit Number  9    Number of Visits  9    Date for PT Re-Evaluation  08/22/17    Authorization Type  BCBS Other    Authorization Time Period  07/25/17 to 08/22/17    Authorization - Visit Number  9    Authorization - Number of Visits  60    PT Start Time  0900    PT Stop Time  0919    PT Time Calculation (min)  19 min    Activity Tolerance  Patient tolerated treatment well;No increased pain    Behavior During Therapy  WFL for tasks assessed/performed       Past Medical History:  Diagnosis Date  . Scoliosis   . 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 Thyroid disease     Past Surgical History:  Procedure Laterality Date  . COLONOSCOPY  08/25/2008   ONG:EXBMWUXLKG ascending colon polyp, status post cold biopsy removal/Remainder of colonic mucosa appeared normal/normal rectum. Tubular adenoma  . COLONOSCOPY N/A 09/26/2013   Procedure: COLONOSCOPY;  Surgeon: Daneil Dolin, MD;  [facility];  Service: Endoscopy;  Laterality: N/A;  12:15  . FOOT SURGERY Right   . thyroid removed  1987  . TUBAL LIGATION      There were no vitals filed for this visit.      [facility] PT Assessment - 08/21/17 0001      Assessment   Medical Diagnosis  Lumbar pain, lumbar spondylosis, radicular syndrome of left leg    Referring Provider  Arther Abbott, MD    Onset Date/Surgical Date  -- October 2018    Next MD Visit  July 2019    Prior Therapy  no PT, chiropractic work      Observation/Other Assessments   Focus on Therapeutic Outcomes (FOTO)   17% limitation was 35% limitation       PROM   Right Hip Internal Rotation   -- WNL, did not cause any pain; was painful    Left Hip Internal Rotation   -- WNL, did not cause pain; was painful    Left Hip ABduction  -- WNL, did not cause pain; was painful      Strength   Right Hip Flexion  5/5 was 4+    Right Hip Extension  4/5 was 4-    Right Hip ABduction  4+/5 was 4    Left Hip Flexion  5/5 was 4+    Left Hip Extension  4/5 was 4-    Left Hip ABduction  4+/5 was 4    Right Knee Flexion  5/5 was 4+    Left Knee Flexion  5/5 was 4+      Thomas Test    Findings  Negative    Side  Left    Comments  no pain reported with testing of LLE, just a good stretch felt      Balance   Balance Assessed  Yes      Static Standing Balance   Static Standing - Balance Support  No upper extremity supported    Static Standing  Balance -  Activities   Single Leg Stance - Right Leg;Single Leg Stance - Left Leg    Static Standing - Comment/# of Minutes  R: 32sec L: 32sec           PT Education - 08/21/17 0920    Education provided  Yes    Education Details  discharge plans, updated HEP    Person(s) Educated  Patient    Methods  Explanation;Handout    Comprehension  Verbalized understanding       PT Short Term Goals - 08/21/17 0902      PT SHORT TERM GOAL #1   Title  Pt will be independent with HEP and perform consisntely in order to decrease pain.    Time  2    Period  Weeks    Status  Achieved      PT SHORT TERM GOAL #2   Title  Pt will be able to perform bil SLS for 30 sec on BLE to demo improved functional BLE strength and maximize function at work.    Time  2    Period  Weeks    Status  Achieved        PT Long Term Goals - 08/21/17 0902      PT LONG TERM GOAL #1   Title  Pt will have imrpoved bil proximal hip strength to decrease overall pain and maximize function at work.     Baseline  5/13: see MMT    Time  4    Period  Weeks    Status  Achieved      PT LONG TERM GOAL #2   Title  Pt will have no  pain with bilateral passive hip IR ROM, Thomas Test on the L, and passive hip abd on the L to demo improved L quad/hip flexor and L hip adductor flexibility in order to decrease overall pain.    Baseline  5/13: no pain reported with any of these objective tests    Time  4    Period  Weeks    Status  Achieved      PT LONG TERM GOAL #3   Title  Pt will report participating in her regular exercise routine at least 2-3x/week without L sided LBP or LLE pain in order to demo improved overall function and maximize pt's QOL.     Baseline  5/13: has not yet started, has only been doing PT HEP but feels like she could return to it without issues    Time  4    Period  Weeks    Status  Partially Met            Plan - 08/21/17 0923    Clinical Impression Statement  PT reassessed pt's goals and outcome measures this date. Pt has met all goals while partially meeting her return to regular exercise goal as she has not yet started her PLOF routine. Her hip strength improved throughout, her balance drastically improved as she was able to perform SLS for >30 sec with no issues, and she reports that she has not had her pain for about the last 2.5-3 weeks. PT updated pt's HEP and educated on how to perform. She was educated that she could return with referral if needed. PT encouraged her to gradually return to her normal exercise routine. At this time, pt is ready for discharge and she verbalized understanding.     Rehab Potential  Good    PT Frequency  2x /  week    PT Duration  4 weeks    PT Treatment/Interventions  ADLs/Self Care Home Management;Cryotherapy;Electrical Stimulation;Moist Heat;Traction;Ultrasound;Gait training;Stair training;Functional mobility training;Therapeutic activities;Therapeutic exercise;Balance training;Neuromuscular re-education;Patient/family education;Manual techniques;Passive range of motion;Dry needling;Energy conservation;Taping    PT Next Visit Plan  discharged    PT Home  Exercise Plan  eval: supine frog stretch, prone quad stretch; 08/10/17: 3D thoracic and hip excursion; 5/6: standing hip flexor stretch, single leg bridges, SLR; 5/13: side stepping, hip abd, mini squats, side plank, side plank +hip abd    Consulted and Agree with Plan of Care  Patient       Patient will benefit from skilled therapeutic intervention in order to improve the following deficits and impairments:  Decreased balance, Decreased range of motion, Decreased strength, Hypomobility, Increased fascial restricitons, Increased muscle spasms, Impaired flexibility, Improper body mechanics, Postural dysfunction, Pain  Visit Diagnosis: Chronic left-sided low back pain without sciatica  Pain in left leg  Muscle weakness (generalized)  Other symptoms and signs involving the musculoskeletal system     Problem List Patient Active Problem List   Diagnosis Date Noted  . Encounter for gynecological examination with Papanicolaou smear of cervix 05/24/2017  . Hip pain, acute, left 05/24/2017  . Hx of adenomatous colonic polyps 09/11/2013  . FHx: colon cancer 09/11/2013  . 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  Patient goals were met. Patient is being discharged due to meeting the stated rehab goals.  ?????       Geraldine Solar PT, [place] [address] [place], [place], [address] Phone: [phone]   Fax:  [phone]  Name: KEIASIA CHRISTIANSON MRN: [id number] Date of Birth: Aug 08, 1965

## 2017-08-23 ENCOUNTER — Ambulatory Visit (HOSPITAL_COMMUNITY): Payer: BLUE CROSS/BLUE SHIELD

## 2017-08-23 DIAGNOSIS — Z1389 Encounter for screening for other disorder: Secondary | ICD-10-CM | POA: Diagnosis not present

## 2017-08-23 DIAGNOSIS — E663 Overweight: Secondary | ICD-10-CM | POA: Diagnosis not present

## 2017-08-23 DIAGNOSIS — M5136 Other intervertebral disc degeneration, lumbar region: Secondary | ICD-10-CM | POA: Diagnosis not present

## 2017-08-23 DIAGNOSIS — M1991 Primary osteoarthritis, unspecified site: Secondary | ICD-10-CM | POA: Diagnosis not present

## 2017-08-23 DIAGNOSIS — E782 Mixed hyperlipidemia: Secondary | ICD-10-CM | POA: Diagnosis not present

## 2017-08-23 DIAGNOSIS — E890 Postprocedural hypothyroidism: Secondary | ICD-10-CM | POA: Diagnosis not present

## 2017-08-23 DIAGNOSIS — Z6827 Body mass index (BMI) 27.0-27.9, adult: Secondary | ICD-10-CM | POA: Diagnosis not present

## 2017-08-25 ENCOUNTER — Encounter (HOSPITAL_COMMUNITY): Payer: BLUE CROSS/BLUE SHIELD

## 2017-08-28 ENCOUNTER — Encounter (HOSPITAL_COMMUNITY): Payer: BLUE CROSS/BLUE SHIELD

## 2017-08-30 ENCOUNTER — Encounter (HOSPITAL_COMMUNITY): Payer: BLUE CROSS/BLUE SHIELD

## 2017-09-01 ENCOUNTER — Encounter (HOSPITAL_COMMUNITY): Payer: BLUE CROSS/BLUE SHIELD

## 2017-09-05 ENCOUNTER — Encounter (HOSPITAL_COMMUNITY): Payer: BLUE CROSS/BLUE SHIELD

## 2017-09-07 ENCOUNTER — Encounter (HOSPITAL_COMMUNITY): Payer: BLUE CROSS/BLUE SHIELD

## 2017-10-11 ENCOUNTER — Ambulatory Visit: Payer: BLUE CROSS/BLUE SHIELD | Admitting: Orthopedic Surgery

## 2017-10-11 ENCOUNTER — Encounter: Payer: Self-pay | Admitting: Orthopedic Surgery

## 2017-10-11 VITALS — BP 147/92 | HR 103 | Ht 67.5 in | Wt 174.0 lb

## 2017-10-11 DIAGNOSIS — M5410 Radiculopathy, site unspecified: Secondary | ICD-10-CM

## 2017-10-11 DIAGNOSIS — M47816 Spondylosis without myelopathy or radiculopathy, lumbar region: Secondary | ICD-10-CM | POA: Diagnosis not present

## 2017-10-11 DIAGNOSIS — M7742 Metatarsalgia, left foot: Secondary | ICD-10-CM

## 2017-10-11 DIAGNOSIS — M545 Low back pain, unspecified: Secondary | ICD-10-CM

## 2017-10-11 MED ORDER — GABAPENTIN 100 MG PO CAPS: 300.0000 mg | ORAL_CAPSULE | Freq: Every day | ORAL | 2 refills | Status: DC

## 2017-10-11 NOTE — Progress Notes (Signed)
Chief Complaint  Patient presents with  . Back Pain    into left foot at times but better since therapy   52 year old female with lumbar spine pain left side radiation left leg x-rays show degenerative disc disease treated with steroids with improvement in overall pain with occasional residual left leg pain radiating to the great toe  Overall she is doing much better after the physical therapy she is at home doing therapy now.  When she is at work she still gets some irritation in the left hip and pain radiating into the left leg.  She complains of a new problem with 4-week history of dull burning nonradiating pain under the first MTP joint.  She says that feels like she stepping on a marble she has some occasional tingling in the foot which she cannot relate whether or not it is related to the radicular pain or not     [facility] Radiology report   Dictated by Dr. Romeo AppleHarrison     Chief complaint lumbar spine 3 views chief complaint hip pain   X-rays show L3-4-L4-5-L5-S1 facet joint narrowing bony sclerosis disc space maintained lumbar lordosis at L3 through S1 and then flattening of the spine L3 and proximal   Slight coronal plane asymmetry and malalignment   Lumbar spondylosis L3 through S1 mild  Review of Systems  Constitutional: Negative for malaise/fatigue.  Gastrointestinal: Negative.   Genitourinary: Negative.   Neurological: Positive for tingling.     BP (!) 147/92   Pulse (!) 103   Ht 5' 7.5" (1.715 m)   Wt 174 lb (78.9 kg)   BMI 26.85 kg/m   Physical Exam  Constitutional: She is oriented to person, place, and time. She appears well-developed and well-nourished.  Neurological: She is alert and oriented to person, place, and time.  Psychiatric: She has a normal mood and affect. Judgment normal.  Vitals reviewed.  LEFT FOOT ALIGNMENT FLEXIBLE PES PLANUS , tenderness under the first metatarsal phalangeal joint, painless range of motion which is full  and unrestricted no swelling around the joint no evidence of bunion deformity Collateral ligaments are stable anterior posterior stress test normal No atrophy is noted in the foot no calluses are seen Sensation is normal Pulse and perfusion normal Skin warm dry intact no erythema  RIGHT FOOT  ALIGNMENT NORMAL  ROM NORMAL  STABILITY NORMAL  MOTOR NORMAL  SENSATION NORMAL  PULSE NORMAL  SKIN NORMAL   Encounter Diagnoses  Name Primary?  . Lumbar spondylosis Yes  . Radicular syndrome of left leg   . Metatarsalgia of left foot    She will continue with gabapentin and home exercises, she will get a metatarsal pad I will see her in 3 months barring any exacerbation of her symptoms

## 2017-12-13 ENCOUNTER — Other Ambulatory Visit: Payer: Self-pay | Admitting: Adult Health

## 2017-12-13 DIAGNOSIS — Z1231 Encounter for screening mammogram for malignant neoplasm of breast: Secondary | ICD-10-CM

## 2018-01-12 ENCOUNTER — Ambulatory Visit: Payer: BLUE CROSS/BLUE SHIELD | Admitting: Orthopedic Surgery

## 2018-01-12 ENCOUNTER — Encounter (HOSPITAL_COMMUNITY): Payer: Self-pay

## 2018-01-15 ENCOUNTER — Ambulatory Visit (HOSPITAL_COMMUNITY)
Admission: RE | Admit: 2018-01-15 | Discharge: 2018-01-15 | Disposition: A | Discharge status: Home / Self Care | Payer: BLUE CROSS/BLUE SHIELD | Source: Ambulatory Visit | Attending: Adult Health | Admitting: Adult Health

## 2018-01-15 ENCOUNTER — Ambulatory Visit: Payer: BLUE CROSS/BLUE SHIELD | Admitting: Orthopedic Surgery

## 2018-01-15 VITALS — BP 136/89 | HR 73 | Ht 67.0 in | Wt 171.0 lb

## 2018-01-15 DIAGNOSIS — Z1231 Encounter for screening mammogram for malignant neoplasm of breast: Secondary | ICD-10-CM

## 2018-01-15 DIAGNOSIS — M7712 Lateral epicondylitis, left elbow: Secondary | ICD-10-CM

## 2018-01-15 DIAGNOSIS — M5410 Radiculopathy, site unspecified: Secondary | ICD-10-CM

## 2018-01-15 NOTE — Progress Notes (Signed)
Follow-up and new problem  Chief Complaint  Patient presents with  . Follow-up    3 month recheck on back.    52 year old female presents for follow-up on her lower back we treated her basically with gabapentin and she made significant improvements we also had her do a therapy program she is still doing that as well as a home exercise program and aerobic activity  She has good resolution of her leg pain with only minimal intermittent back and leg pain depending on activity  New problem the patient states she was doing some yard work using a [employer] about 6 weeks ago started having lateral elbow pain which did not respond to the Celebrex.  She has a dull aching pain over the lateral elbow which is worse with wrist extension severity mild to moderate   Review of Systems  Constitutional: Negative for chills and fever.  Musculoskeletal: Positive for joint pain.  Skin: Negative.   Neurological: Negative for tingling, sensory change and focal weakness.   Past Medical History:  Diagnosis Date  . Scoliosis   . 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 Thyroid disease     Past Surgical History:  Procedure Laterality Date  . COLONOSCOPY  08/25/2008   UJW:[id number] ascending colon polyp, status post cold biopsy removal/Remainder of colonic mucosa appeared normal/normal rectum. Tubular adenoma  . COLONOSCOPY N/A 09/26/2013   Procedure: COLONOSCOPY;  Surgeon: Corbin Ade, MD;  [facility];  Service: Endoscopy;  Laterality: N/A;  12:15  . FOOT SURGERY Right   . thyroid removed  1987  . TUBAL LIGATION      Family History  Problem Relation Age of Onset  . Hypertension Mother   . Cancer Sister 79       colon  . Hypertension Maternal Aunt   . Hyperlipidemia Maternal Aunt   . Cancer Maternal Aunt        ovarian; had hyst  . Cancer Maternal Uncle        lung   Social History   Tobacco Use  . Smoking status: Never Smoker  .  Smokeless tobacco: Never Used  Substance Use Topics  . Alcohol use: Yes    Comment: occ  . Drug use: No    No Known Allergies  No outpatient medications have been marked as taking for the 01/15/18 encounter (Office Visit) with Vickki Hearing, MD.    BP 136/89   Pulse 73   Ht 5\' 7"  (1.702 m)   Wt 171 lb (77.6 kg)   BMI 26.78 kg/m   Physical Exam  Constitutional: She is oriented to person, place, and time. She appears well-developed and well-nourished.  Neurological: She is alert and oriented to person, place, and time.  Psychiatric: She has a normal mood and affect. Judgment normal.  Vitals reviewed.   Ortho Exam  Left elbow tenderness over the lateral epicondyle no swelling she has full range of motion with pain with terminal elbow extension the elbow is stable to varus valgus stress test she has no strength deficits the skin is without rash she has normal pulse and normal sensation in the left arm  The right arm is normal terms of range of motion there is no tenderness or swelling stability and strength tests are normal  MEDICAL DECISION SECTION  Xrays were done at N/A My independent reading of xrays:  N/A  Encounter Diagnoses  Name Primary?  . Radicular syndrome of  left leg   . Lateral epicondylitis of left elbow Yes    PLAN: (Rx., injectx, surgery, frx, mri/ct) Lateral elbow injection  Continue Celebrex  Tennis elbow brace 6 WEEKS   Procedure note injection for left tennis elbow  Diagnosis left tennis elbow  Anesthesia ethyl chloride was used Alcohol use is clean the skin  After we obtained verbal consent and timeout a 25-gauge needle was used to inject 40 mg of Depo-Medrol and 3 cc of 1% lidocaine just distal to the insertion of the ECRB  There were no complications and a sterile bandage was applied.   No orders of the defined types were placed in this encounter.   Fuller Canada, MD  01/15/2018 2:59 PM

## 2018-01-27 ENCOUNTER — Other Ambulatory Visit: Payer: Self-pay | Admitting: Orthopedic Surgery

## 2018-01-27 DIAGNOSIS — M5410 Radiculopathy, site unspecified: Secondary | ICD-10-CM

## 2018-01-27 DIAGNOSIS — M545 Low back pain, unspecified: Secondary | ICD-10-CM

## 2018-01-27 DIAGNOSIS — M47816 Spondylosis without myelopathy or radiculopathy, lumbar region: Secondary | ICD-10-CM

## 2018-08-22 DIAGNOSIS — E7849 Other hyperlipidemia: Secondary | ICD-10-CM | POA: Diagnosis not present

## 2018-08-22 DIAGNOSIS — Z1389 Encounter for screening for other disorder: Secondary | ICD-10-CM | POA: Diagnosis not present

## 2018-08-22 DIAGNOSIS — M1991 Primary osteoarthritis, unspecified site: Secondary | ICD-10-CM | POA: Diagnosis not present

## 2018-08-22 DIAGNOSIS — E663 Overweight: Secondary | ICD-10-CM | POA: Diagnosis not present

## 2018-08-22 DIAGNOSIS — Z6828 Body mass index (BMI) 28.0-28.9, adult: Secondary | ICD-10-CM | POA: Diagnosis not present

## 2018-08-22 DIAGNOSIS — E890 Postprocedural hypothyroidism: Secondary | ICD-10-CM | POA: Diagnosis not present

## 2019-01-24 ENCOUNTER — Other Ambulatory Visit (HOSPITAL_COMMUNITY): Payer: Self-pay | Admitting: Family Medicine

## 2019-01-24 DIAGNOSIS — Z1231 Encounter for screening mammogram for malignant neoplasm of breast: Secondary | ICD-10-CM

## 2019-02-07 ENCOUNTER — Other Ambulatory Visit: Payer: Self-pay

## 2019-02-07 ENCOUNTER — Ambulatory Visit (HOSPITAL_COMMUNITY)
Admission: RE | Admit: 2019-02-07 | Discharge: 2019-02-07 | Disposition: A | Discharge status: Home / Self Care | Payer: BC Managed Care – PPO | Source: Ambulatory Visit | Attending: Family Medicine | Admitting: Family Medicine

## 2019-02-07 DIAGNOSIS — Z1231 Encounter for screening mammogram for malignant neoplasm of breast: Secondary | ICD-10-CM | POA: Diagnosis not present

## 2019-02-13 ENCOUNTER — Other Ambulatory Visit (HOSPITAL_COMMUNITY): Payer: Self-pay | Admitting: Physician Assistant

## 2019-02-13 DIAGNOSIS — R928 Other abnormal and inconclusive findings on diagnostic imaging of breast: Secondary | ICD-10-CM

## 2019-02-26 ENCOUNTER — Other Ambulatory Visit: Payer: Self-pay

## 2019-02-26 ENCOUNTER — Ambulatory Visit (HOSPITAL_COMMUNITY)
Admission: RE | Admit: 2019-02-26 | Discharge: 2019-02-26 | Disposition: A | Discharge status: Home / Self Care | Payer: BC Managed Care – PPO | Source: Ambulatory Visit | Attending: Physician Assistant | Admitting: Physician Assistant

## 2019-02-26 DIAGNOSIS — R928 Other abnormal and inconclusive findings on diagnostic imaging of breast: Secondary | ICD-10-CM

## 2019-02-26 DIAGNOSIS — N6489 Other specified disorders of breast: Secondary | ICD-10-CM | POA: Diagnosis not present

## 2019-09-02 DIAGNOSIS — Z6827 Body mass index (BMI) 27.0-27.9, adult: Secondary | ICD-10-CM | POA: Diagnosis not present

## 2019-09-02 DIAGNOSIS — Z0001 Encounter for general adult medical examination with abnormal findings: Secondary | ICD-10-CM | POA: Diagnosis not present

## 2019-09-02 DIAGNOSIS — E7849 Other hyperlipidemia: Secondary | ICD-10-CM | POA: Diagnosis not present

## 2019-09-02 DIAGNOSIS — Z1389 Encounter for screening for other disorder: Secondary | ICD-10-CM | POA: Diagnosis not present

## 2019-09-02 DIAGNOSIS — E890 Postprocedural hypothyroidism: Secondary | ICD-10-CM | POA: Diagnosis not present

## 2019-09-02 DIAGNOSIS — R000 Tachycardia, unspecified: Secondary | ICD-10-CM | POA: Diagnosis not present

## 2019-09-02 DIAGNOSIS — M5136 Other intervertebral disc degeneration, lumbar region: Secondary | ICD-10-CM | POA: Diagnosis not present

## 2019-10-03 DIAGNOSIS — R945 Abnormal results of liver function studies: Secondary | ICD-10-CM | POA: Diagnosis not present

## 2019-10-28 IMAGING — MG DIGITAL SCREENING BILATERAL MAMMOGRAM WITH TOMO AND CAD
8 series · 9 of 24 positions shown · non-contrast
Comparison: Previous exam(s).

CLINICAL DATA: Screening.

EXAM:
DIGITAL SCREENING BILATERAL MAMMOGRAM WITH TOMO AND CAD

[R CC synth-2D]
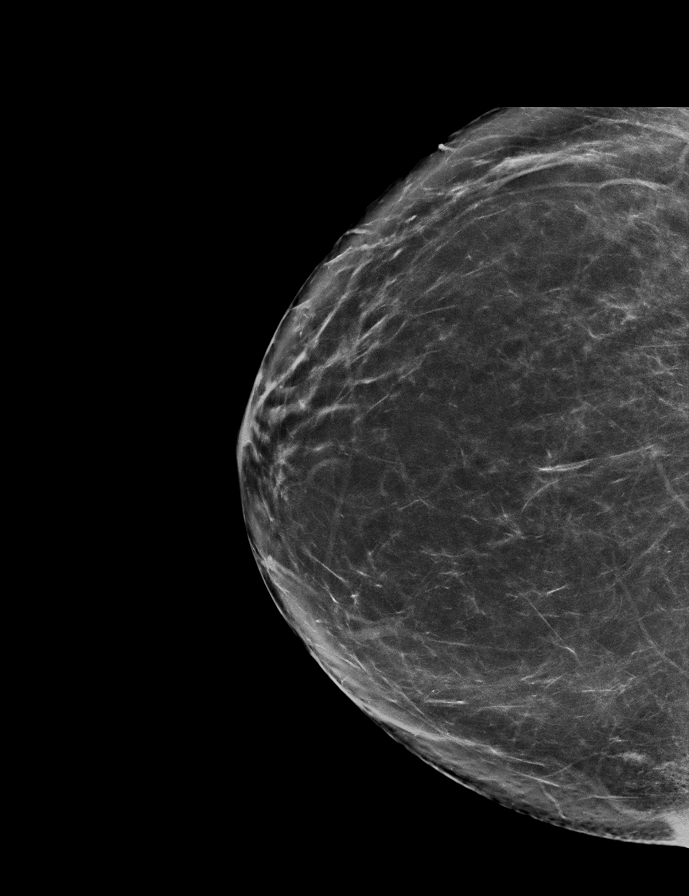

[L MLO synth-2D]
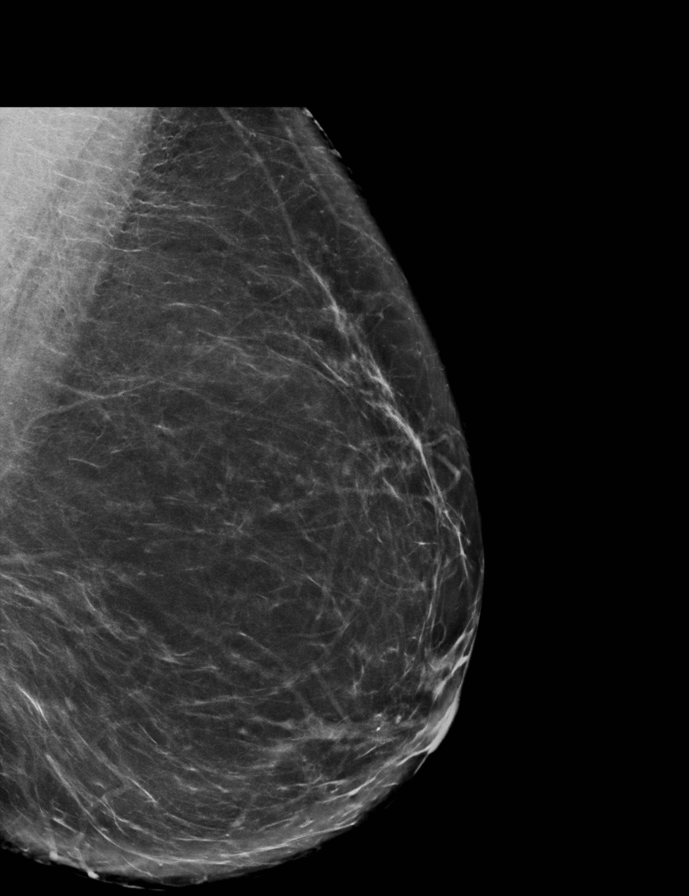

[R MLO synth-2D]
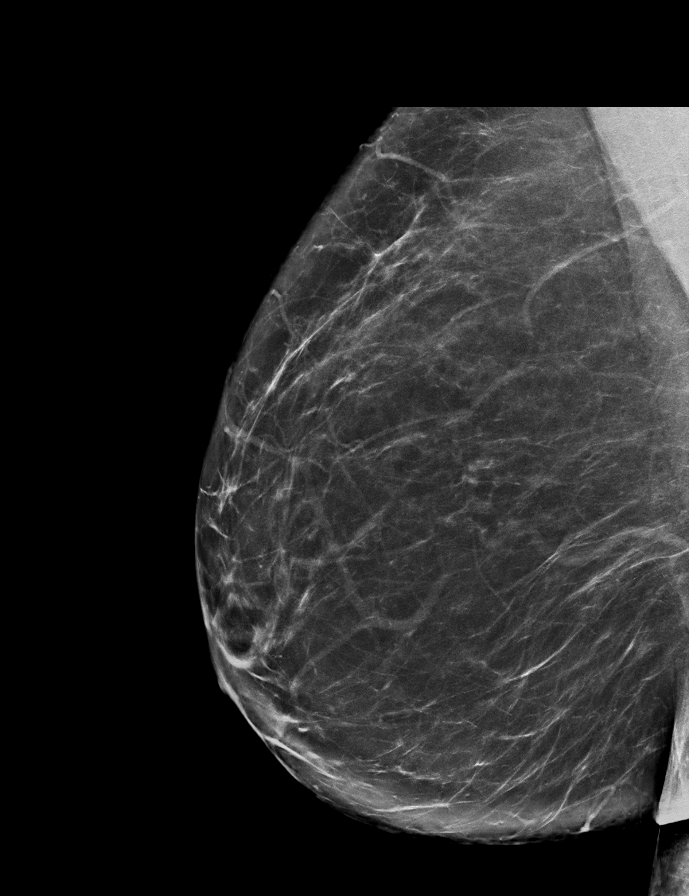

[L CC synth-2D]
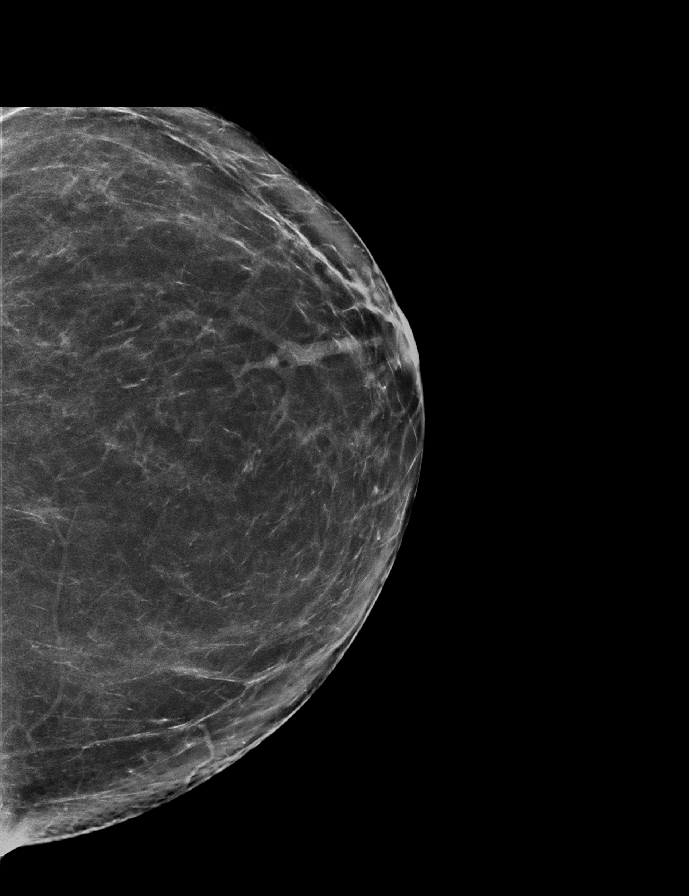

[L MLO tomo · 2 of 85 frames shown]
[frame 28/85]
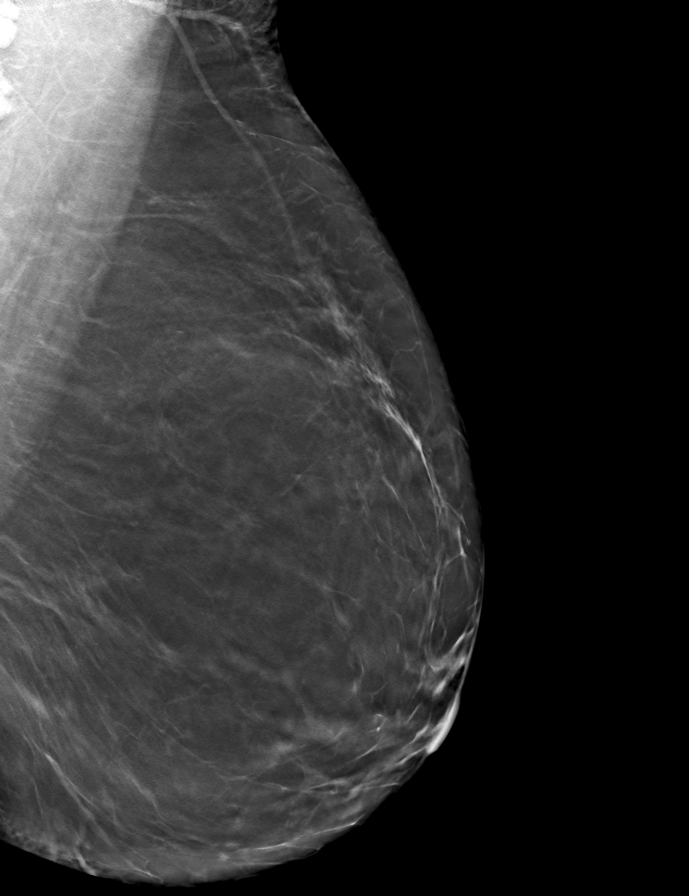
[frame 43/85]
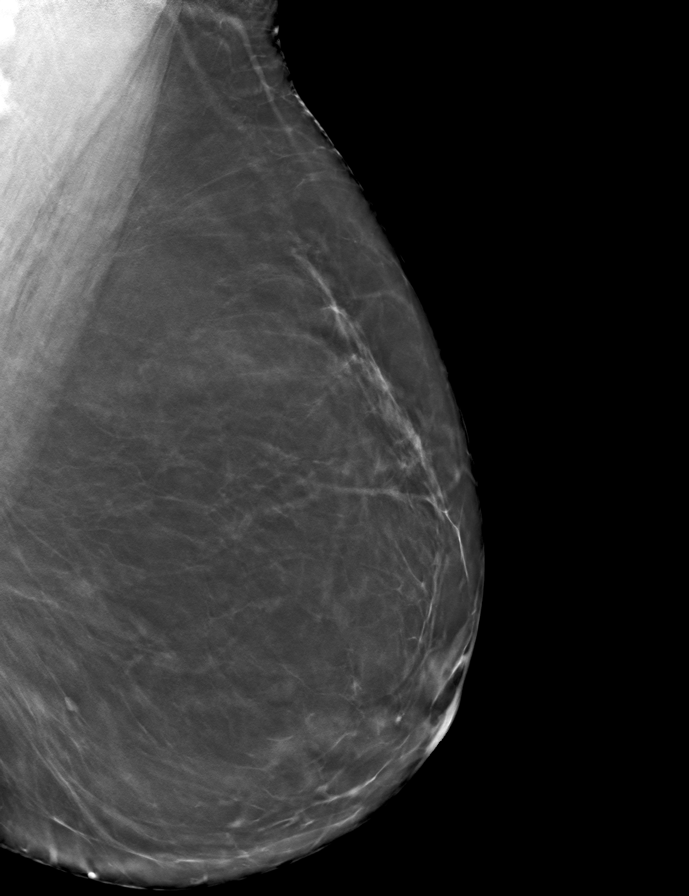

[L CC tomo · tomo slice 39/77.0]
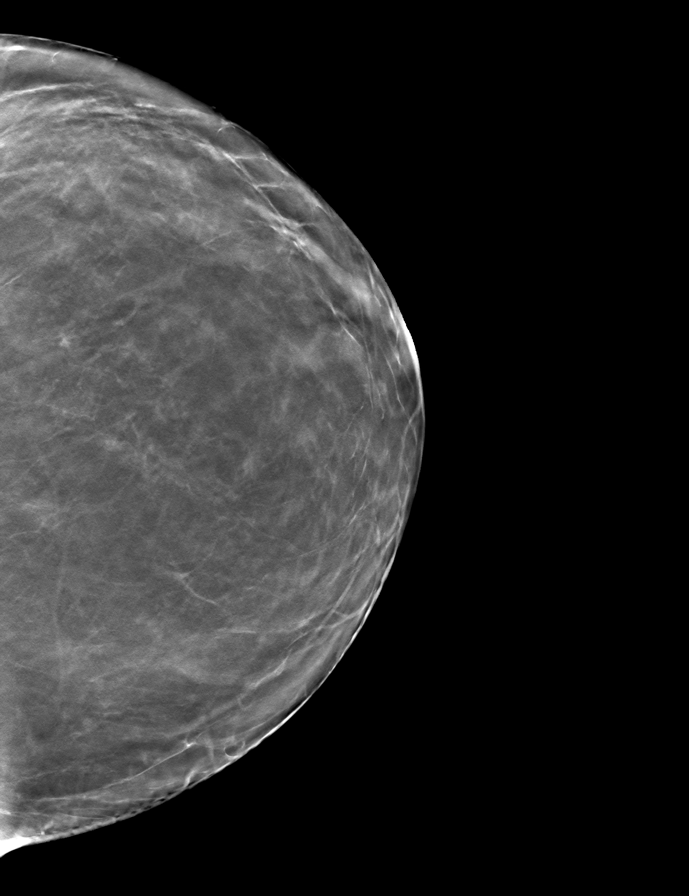

[R MLO tomo · tomo slice 41/82.0]
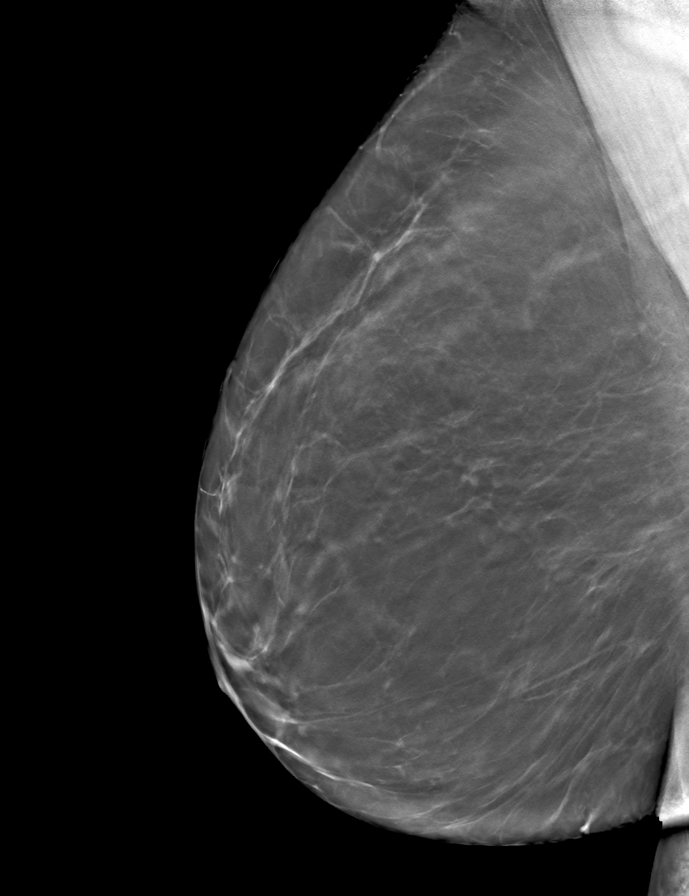

[R CC tomo · tomo slice 39/76.0]
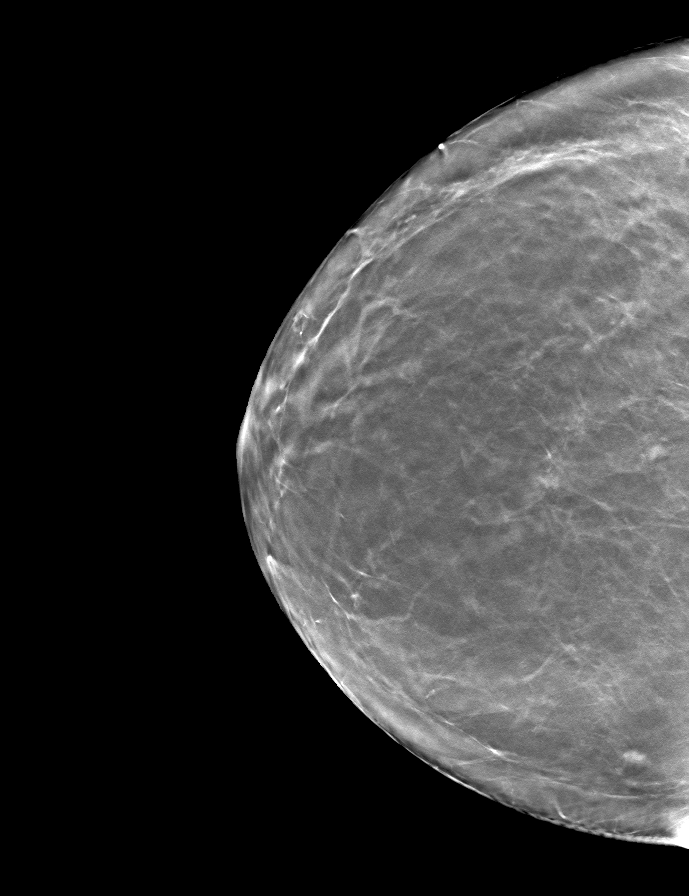

[9 of 24 positions shown; findings below may reference images not displayed]

ACR Breast Density Category b: There are scattered areas of
fibroglandular density.
FINDINGS: There are no findings suspicious for malignancy. Images were
processed with CAD.
IMPRESSION: No mammographic evidence of malignancy. A result letter of this
screening mammogram will be mailed directly to the patient.

RECOMMENDATION:
Screening mammogram in one year. (Code:CN-U-775)

BI-RADS CATEGORY  1: Negative.

## 2019-12-06 DIAGNOSIS — M71341 Other bursal cyst, right hand: Secondary | ICD-10-CM | POA: Diagnosis not present

## 2019-12-06 DIAGNOSIS — E663 Overweight: Secondary | ICD-10-CM | POA: Diagnosis not present

## 2019-12-06 DIAGNOSIS — Z6827 Body mass index (BMI) 27.0-27.9, adult: Secondary | ICD-10-CM | POA: Diagnosis not present

## 2019-12-20 DIAGNOSIS — R52 Pain, unspecified: Secondary | ICD-10-CM | POA: Diagnosis not present

## 2019-12-20 DIAGNOSIS — R2231 Localized swelling, mass and lump, right upper limb: Secondary | ICD-10-CM | POA: Diagnosis not present

## 2019-12-27 ENCOUNTER — Other Ambulatory Visit: Payer: Self-pay | Admitting: Orthopedic Surgery

## 2019-12-27 DIAGNOSIS — R2231 Localized swelling, mass and lump, right upper limb: Secondary | ICD-10-CM

## 2020-01-16 ENCOUNTER — Ambulatory Visit
Admission: RE | Admit: 2020-01-16 | Discharge: 2020-01-16 | Disposition: A | Discharge status: Home / Self Care | Payer: BC Managed Care – PPO | Source: Ambulatory Visit | Attending: Orthopedic Surgery | Admitting: Orthopedic Surgery

## 2020-01-16 DIAGNOSIS — R2231 Localized swelling, mass and lump, right upper limb: Secondary | ICD-10-CM

## 2020-01-16 DIAGNOSIS — M67441 Ganglion, right hand: Secondary | ICD-10-CM | POA: Diagnosis not present

## 2020-01-27 DIAGNOSIS — R2231 Localized swelling, mass and lump, right upper limb: Secondary | ICD-10-CM | POA: Diagnosis not present

## 2020-01-28 ENCOUNTER — Other Ambulatory Visit: Payer: Self-pay | Admitting: Orthopedic Surgery

## 2020-02-06 ENCOUNTER — Encounter (HOSPITAL_BASED_OUTPATIENT_CLINIC_OR_DEPARTMENT_OTHER): Payer: Self-pay | Admitting: Orthopedic Surgery

## 2020-02-06 ENCOUNTER — Other Ambulatory Visit: Payer: Self-pay

## 2020-02-10 ENCOUNTER — Other Ambulatory Visit (HOSPITAL_COMMUNITY)
Admission: RE | Admit: 2020-02-10 | Discharge: 2020-02-10 | Disposition: A | Discharge status: Home / Self Care | Payer: BC Managed Care – PPO | Source: Ambulatory Visit | Attending: Orthopedic Surgery | Admitting: Orthopedic Surgery

## 2020-02-10 ENCOUNTER — Other Ambulatory Visit (HOSPITAL_COMMUNITY): Payer: BC Managed Care – PPO

## 2020-02-10 ENCOUNTER — Other Ambulatory Visit: Payer: Self-pay

## 2020-02-10 DIAGNOSIS — Z01812 Encounter for preprocedural laboratory examination: Secondary | ICD-10-CM | POA: Diagnosis not present

## 2020-02-10 DIAGNOSIS — Z20822 Contact with and (suspected) exposure to covid-19: Secondary | ICD-10-CM | POA: Insufficient documentation

## 2020-02-10 NOTE — Progress Notes (Signed)

## 2020-02-11 LAB — SARS CORONAVIRUS 2 (TAT 6-24 HRS): SARS Coronavirus 2: NEGATIVE

## 2020-02-13 ENCOUNTER — Ambulatory Visit (HOSPITAL_BASED_OUTPATIENT_CLINIC_OR_DEPARTMENT_OTHER)
Admission: RE | Admit: 2020-02-13 | Discharge: 2020-02-13 | Disposition: A | Discharge status: Home / Self Care | Payer: BC Managed Care – PPO | Attending: Orthopedic Surgery | Admitting: Orthopedic Surgery

## 2020-02-13 ENCOUNTER — Encounter (HOSPITAL_BASED_OUTPATIENT_CLINIC_OR_DEPARTMENT_OTHER): Payer: Self-pay | Admitting: Orthopedic Surgery

## 2020-02-13 ENCOUNTER — Ambulatory Visit (HOSPITAL_BASED_OUTPATIENT_CLINIC_OR_DEPARTMENT_OTHER): Payer: BC Managed Care – PPO | Admitting: Certified Registered"

## 2020-02-13 ENCOUNTER — Encounter (HOSPITAL_BASED_OUTPATIENT_CLINIC_OR_DEPARTMENT_OTHER)
Admission: RE | Disposition: A | Discharge status: Home / Self Care | Payer: Self-pay | Source: Home / Self Care | Attending: Orthopedic Surgery

## 2020-02-13 ENCOUNTER — Other Ambulatory Visit: Payer: Self-pay

## 2020-02-13 DIAGNOSIS — M67441 Ganglion, right hand: Secondary | ICD-10-CM | POA: Insufficient documentation

## 2020-02-13 DIAGNOSIS — M67843 Other specified disorders of tendon, right hand: Secondary | ICD-10-CM | POA: Diagnosis not present

## 2020-02-13 DIAGNOSIS — Z86010 Personal history of colonic polyps: Secondary | ICD-10-CM | POA: Diagnosis not present

## 2020-02-13 HISTORY — PX: CYST EXCISION: SHX5701

## 2020-02-13 SURGERY — CYST REMOVAL
Anesthesia: Regional | Site: Finger | Laterality: Right

## 2020-02-13 MED ORDER — PROPOFOL 10 MG/ML IV BOLUS
INTRAVENOUS | Status: DC | PRN
  Administered 2020-02-13: 20 mg via INTRAVENOUS
  Administered 2020-02-13: 50 ug/kg/min via INTRAVENOUS
  Administered 2020-02-13: 20 mg via INTRAVENOUS

## 2020-02-13 MED ORDER — OXYCODONE HCL 5 MG/5ML PO SOLN: 5.0000 mg | Freq: Once | ORAL | Status: DC | PRN

## 2020-02-13 MED ORDER — CEFAZOLIN SODIUM-DEXTROSE 2-4 GM/100ML-% IV SOLN
INTRAVENOUS | Status: AC
  Filled 2020-02-13: qty 100

## 2020-02-13 MED ORDER — BUPIVACAINE HCL (PF) 0.25 % IJ SOLN
INTRAMUSCULAR | Status: DC | PRN
  Administered 2020-02-13: 8 mL

## 2020-02-13 MED ORDER — PROPOFOL 500 MG/50ML IV EMUL
INTRAVENOUS | Status: AC
  Filled 2020-02-13: qty 50

## 2020-02-13 MED ORDER — ONDANSETRON HCL 4 MG/2ML IJ SOLN
INTRAMUSCULAR | Status: AC
  Filled 2020-02-13: qty 2

## 2020-02-13 MED ORDER — FENTANYL CITRATE (PF) 100 MCG/2ML IJ SOLN
INTRAMUSCULAR | Status: AC
  Filled 2020-02-13: qty 2

## 2020-02-13 MED ORDER — MIDAZOLAM HCL 5 MG/5ML IJ SOLN
INTRAMUSCULAR | Status: DC | PRN
  Administered 2020-02-13 (×2): 1 mg via INTRAVENOUS

## 2020-02-13 MED ORDER — HYDROMORPHONE HCL 1 MG/ML IJ SOLN: 0.2500 mg | INTRAMUSCULAR | Status: DC | PRN

## 2020-02-13 MED ORDER — BUPIVACAINE HCL (PF) 0.25 % IJ SOLN
INTRAMUSCULAR | Status: AC
  Filled 2020-02-13: qty 60

## 2020-02-13 MED ORDER — TRAMADOL HCL 50 MG PO TABS
50.0000 mg | ORAL_TABLET | Freq: Four times a day (QID) | ORAL | 0 refills | Status: DC | PRN
Start: 2020-02-13 — End: 2023-09-29

## 2020-02-13 MED ORDER — ONDANSETRON HCL 4 MG/2ML IJ SOLN
INTRAMUSCULAR | Status: DC | PRN
  Administered 2020-02-13: 4 mg via INTRAVENOUS

## 2020-02-13 MED ORDER — PROMETHAZINE HCL 25 MG/ML IJ SOLN: 6.2500 mg | INTRAMUSCULAR | Status: DC | PRN

## 2020-02-13 MED ORDER — CEFAZOLIN SODIUM-DEXTROSE 2-4 GM/100ML-% IV SOLN
2.0000 g | INTRAVENOUS | Status: AC
  Administered 2020-02-13: 2 g via INTRAVENOUS

## 2020-02-13 MED ORDER — PROPOFOL 10 MG/ML IV BOLUS
INTRAVENOUS | Status: AC
  Filled 2020-02-13: qty 20

## 2020-02-13 MED ORDER — MIDAZOLAM HCL 2 MG/2ML IJ SOLN
INTRAMUSCULAR | Status: AC
  Filled 2020-02-13: qty 2

## 2020-02-13 MED ORDER — LACTATED RINGERS IV SOLN: INTRAVENOUS | Status: DC

## 2020-02-13 MED ORDER — FENTANYL CITRATE (PF) 100 MCG/2ML IJ SOLN
INTRAMUSCULAR | Status: DC | PRN
  Administered 2020-02-13 (×2): 50 ug via INTRAVENOUS

## 2020-02-13 MED ORDER — OXYCODONE HCL 5 MG PO TABS: 5.0000 mg | ORAL_TABLET | Freq: Once | ORAL | Status: DC | PRN

## 2020-02-13 SURGICAL SUPPLY — 49 items
APL PRP STRL LF DISP 70% ISPRP (MISCELLANEOUS) ×1
BLADE MINI RND TIP GREEN BEAV (BLADE) IMPLANT
BLADE SURG 15 STRL LF DISP TIS (BLADE) ×1 IMPLANT
BLADE SURG 15 STRL SS (BLADE) ×2
BNDG CMPR 9X4 STRL LF SNTH (GAUZE/BANDAGES/DRESSINGS)
BNDG COHESIVE 1X5 TAN STRL LF (GAUZE/BANDAGES/DRESSINGS) ×2 IMPLANT
BNDG COHESIVE 2X5 TAN STRL LF (GAUZE/BANDAGES/DRESSINGS) IMPLANT
BNDG COHESIVE 3X5 TAN STRL LF (GAUZE/BANDAGES/DRESSINGS) IMPLANT
BNDG ESMARK 4X9 LF (GAUZE/BANDAGES/DRESSINGS) IMPLANT
BNDG GAUZE ELAST 4 BULKY (GAUZE/BANDAGES/DRESSINGS) IMPLANT
CHLORAPREP W/TINT 26 (MISCELLANEOUS) ×2 IMPLANT
CORD BIPOLAR FORCEPS 12FT (ELECTRODE) ×2 IMPLANT
COVER BACK TABLE 60X90IN (DRAPES) ×2 IMPLANT
COVER MAYO STAND STRL (DRAPES) ×2 IMPLANT
COVER WAND RF STERILE (DRAPES) IMPLANT
CUFF TOURN SGL QUICK 18X4 (TOURNIQUET CUFF) ×2 IMPLANT
DECANTER SPIKE VIAL GLASS SM (MISCELLANEOUS) IMPLANT
DRAIN PENROSE 1/2X12 LTX STRL (WOUND CARE) IMPLANT
DRAPE EXTREMITY T 121X128X90 (DISPOSABLE) ×2 IMPLANT
DRAPE SURG 17X23 STRL (DRAPES) ×2 IMPLANT
GAUZE SPONGE 4X4 12PLY STRL (GAUZE/BANDAGES/DRESSINGS) ×2 IMPLANT
GAUZE XEROFORM 1X8 LF (GAUZE/BANDAGES/DRESSINGS) ×2 IMPLANT
GLOVE BIOGEL PI IND STRL 6.5 (GLOVE) ×1 IMPLANT
GLOVE BIOGEL PI IND STRL 8.5 (GLOVE) ×1 IMPLANT
GLOVE BIOGEL PI INDICATOR 6.5 (GLOVE) ×1
GLOVE BIOGEL PI INDICATOR 8.5 (GLOVE) ×1
GLOVE ECLIPSE 6.5 STRL STRAW (GLOVE) ×2 IMPLANT
GLOVE SURG ORTHO 8.0 STRL STRW (GLOVE) ×2 IMPLANT
GOWN STRL REUS W/ TWL LRG LVL3 (GOWN DISPOSABLE) ×1 IMPLANT
GOWN STRL REUS W/TWL LRG LVL3 (GOWN DISPOSABLE) ×2
GOWN STRL REUS W/TWL XL LVL3 (GOWN DISPOSABLE) ×2 IMPLANT
NEEDLE PRECISIONGLIDE 27X1.5 (NEEDLE) ×2 IMPLANT
NS IRRIG 1000ML POUR BTL (IV SOLUTION) ×2 IMPLANT
PACK BASIN DAY SURGERY FS (CUSTOM PROCEDURE TRAY) ×2 IMPLANT
PAD CAST 3X4 CTTN HI CHSV (CAST SUPPLIES) IMPLANT
PADDING CAST ABS 3INX4YD NS (CAST SUPPLIES)
PADDING CAST ABS 4INX4YD NS (CAST SUPPLIES) ×1
PADDING CAST ABS COTTON 3X4 (CAST SUPPLIES) IMPLANT
PADDING CAST ABS COTTON 4X4 ST (CAST SUPPLIES) ×1 IMPLANT
PADDING CAST COTTON 3X4 STRL (CAST SUPPLIES)
SPLINT PLASTER CAST XFAST 3X15 (CAST SUPPLIES) IMPLANT
SPLINT PLASTER XTRA FASTSET 3X (CAST SUPPLIES)
STOCKINETTE 4X48 STRL (DRAPES) ×2 IMPLANT
SUT ETHILON 4 0 PS 2 18 (SUTURE) ×2 IMPLANT
SUT VIC AB 4-0 P2 18 (SUTURE) IMPLANT
SYR BULB EAR ULCER 3OZ GRN STR (SYRINGE) ×2 IMPLANT
SYR CONTROL 10ML LL (SYRINGE) ×2 IMPLANT
TOWEL GREEN STERILE FF (TOWEL DISPOSABLE) ×4 IMPLANT
UNDERPAD 30X36 HEAVY ABSORB (UNDERPADS AND DIAPERS) ×2 IMPLANT

## 2020-02-13 NOTE — Anesthesia Postprocedure Evaluation (Signed)
Anesthesia Post Note  Patient: Tammy [name]  Procedure(s) Performed: CYST REMOVAL RIGHT MIDDLE AND SMALL FINGERS (Right Finger)     Patient location during evaluation: PACU Anesthesia Type: Bier Block Level of consciousness: awake and alert Pain management: pain level controlled Vital Signs Assessment: post-procedure vital signs reviewed and stable Respiratory status: spontaneous breathing, nonlabored ventilation and respiratory function stable Cardiovascular status: blood pressure returned to baseline and stable Postop Assessment: no apparent nausea or vomiting Anesthetic complications: no   No complications documented.  Last Vitals:  Vitals:   02/13/20 1000 02/13/20 1015  BP: (!) 141/88 (!) 144/92  Pulse: 67 71  Resp: 17 16  Temp:  36.7 C  SpO2: 98% 98%    Last Pain:  Vitals:   02/13/20 1015  TempSrc:   PainSc: 0-No pain                 Lowella Curb

## 2020-02-13 NOTE — Brief Op Note (Signed)
02/13/2020  9:33 AM  PATIENT:  Tammy [name]  54 y.o. female  PRE-OPERATIVE DIAGNOSIS:  RIGHT MIDDLE AND SMALL FLEXOR SHEATH CYST  POST-OPERATIVE DIAGNOSIS:  RIGHT MIDDLE AND SMALL FLEXOR SHEATH CYST  PROCEDURE:  Procedure(s) with comments: CYST REMOVAL RIGHT MIDDLE AND SMALL FINGERS (Right) - IV REGIONAL FOREARM BLOCK  SURGEON:  Surgeon(s) and Role:    * Cindee Salt, MD - Primary  PHYSICIAN ASSISTANT:   ASSISTANTS: none   ANESTHESIA:   local, regional and IV sedation  EBL: 46ml BLOOD ADMINISTERED:none  DRAINS: none   LOCAL MEDICATIONS USED:  BUPIVICAINE   SPECIMEN:  Excision  DISPOSITION OF SPECIMEN:  PATHOLOGY  COUNTS:  YES  TOURNIQUET:   Total Tourniquet Time Documented: Forearm (Right) - 36 minutes Total: Forearm (Right) - 36 minutes   DICTATION: .Reubin Milan Dictation  PLAN OF CARE: Discharge to home after PACU  PATIENT DISPOSITION:  PACU - hemodynamically stable.

## 2020-02-13 NOTE — Anesthesia Procedure Notes (Signed)
Anesthesia Regional Block: Bier block (IV Regional)   Pre-Anesthetic Checklist: ,, timeout performed, Correct Patient, Correct Site, Correct Laterality, Correct Procedure,, site marked, surgical consent,, at surgeon's request  Laterality: Right     Needles:  Injection technique: Single-shot  Needle Type: Other      Needle Gauge: 22     Additional Needles:   Procedures:,,,,, intact distal pulses, Esmarch exsanguination, single tourniquet utilized,  Narrative:  Start time: 02/13/2020 8:51 AM End time: 02/13/2020 8:52 AM  Performed by: Personally

## 2020-02-13 NOTE — H&P (Signed)
  Tammy [name] is an 54 y.o. female.   Chief Complaint: mass right middle and small fingers HPI: Tammy [name] is a 54 year old female referred by Dr. Dannielle Huh for consultation regarding knot in the palm of her hand small finger and right middle finger radial aspect volarly. She states these have been present for approximately a year on each finger. She recalls no history of injury they do not cause any significant pain or discomfort at the present time. She is taking gabapentin for her hip and feels this may have given her some relief. She has no history of injury to either area. She states ice and heat will help. She is taken occasional ibuprofen. She has a history of thyroid problems arthritis no history of diabetes or gout. Family history is negative for each of these.She has had the ultrasound done of her middle finger which reveals a cystic lesion 1.1 x 0.5 x 0.8 cm between the neurovascular bundle and flexor tendon. This read out by Dr. Charise Killian    Past Medical History:  Diagnosis Date  . Scoliosis   . 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 Thyroid disease     Past Surgical History:  Procedure Laterality Date  . COLONOSCOPY  08/25/2008   OIN:OMVEHMCNOB ascending colon polyp, status post cold biopsy removal/Remainder of colonic mucosa appeared normal/normal rectum. Tubular adenoma  . COLONOSCOPY N/A 09/26/2013   Procedure: COLONOSCOPY;  Surgeon: Corbin Ade, MD;  Location: [facility];  Service: Endoscopy;  Laterality: N/A;  12:15  . FOOT SURGERY Right   . thyroid removed  1987  . TUBAL LIGATION      Family History  Problem Relation Age of Onset  . Hypertension Mother   . Cancer Sister 6       colon  . Hypertension Maternal Aunt   . Hyperlipidemia Maternal Aunt   . Cancer Maternal Aunt        ovarian; had hyst  . Cancer Maternal Uncle        lung   Social History:  reports that she has never smoked. She has never used smokeless tobacco.  She reports current alcohol use. She reports that she does not use drugs.  Allergies: No Known Allergies  No medications prior to admission.    No results found for this or any previous visit (from the past 48 hour(s)).  No results found.   Pertinent items are noted in HPI.  Height 5\' 7"  (1.702 m), weight 77.1 kg.  General appearance: alert, cooperative and appears stated age Head: Normocephalic, without obvious abnormality Neck: no JVD Resp: clear to auscultation bilaterally Cardio: regular rate and rhythm, S1, S2 normal, no murmur, click, rub or gallop GI: soft, non-tender; bowel sounds normal; no masses,  no organomegaly Extremities: mass right middle and small fingers Pulses: 2+ and symmetric Skin: Skin color, texture, turgor normal. 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 She would like to have these removed. Preperi-and postoperative course been discussed along with risk and complications. She is aware that there is no guarantee to the surgery the possibility of infection recurrence injury to arteries nerves tendons complete relief symptoms dystrophy. She is scheduled for excision mass right middle and right small fingers as an outpatient under regional anesthesia.    02/13/2020, 6:09 AM

## 2020-02-13 NOTE — Transfer of Care (Signed)
Immediate Anesthesia Transfer of Care Note  Patient: Tammy [name]  Procedure(s) Performed: CYST REMOVAL RIGHT MIDDLE AND SMALL FINGERS (Right Finger)  Patient Location: PACU  Anesthesia Type:MAC and Bier block  Level of Consciousness: awake, alert  and oriented  Airway & Oxygen Therapy: Patient Spontanous Breathing and Patient connected to face mask oxygen  Post-op Assessment: Report given to RN and Post -op Vital signs reviewed and stable  Post vital signs: Reviewed and stable  Last Vitals:  Vitals Value Taken Time  BP 104/76 02/13/20 0934  Temp    Pulse 59 02/13/20 0934  Resp 16 02/13/20 0934  SpO2 100 % 02/13/20 0934    Last Pain:  Vitals:   02/13/20 0728  TempSrc: Oral  PainSc: 0-No pain         Complications: No complications documented.

## 2020-02-13 NOTE — Op Note (Signed)
NAME: Tammy [name] MEDICAL RECORD NO: [id number] DATE OF BIRTH: Feb 20, 1966 FACILITY: [facility] LOCATION: [facility] PHYSICIAN: Nicki Reaper, MD   OPERATIVE REPORT   DATE OF PROCEDURE: 02/13/20    PREOPERATIVE DIAGNOSIS:   Flexor sheath cyst right small and right middle fingers   POSTOPERATIVE DIAGNOSIS:   Same   PROCEDURE:   Excision flexor sheath cyst right small right middle fingers size 1-1/2 cm middle 5 mm small   SURGEON: Cindee Salt, M.D.   ASSISTANT: none   ANESTHESIA:  Regional with sedation and Local   INTRAVENOUS FLUIDS:  Per anesthesia flow sheet.   ESTIMATED BLOOD LOSS:  Minimal.   COMPLICATIONS:  None.   SPECIMENS:   Cyst   TOURNIQUET TIME:    Total Tourniquet Time Documented: Forearm (Right) - 36 minutes Total: Forearm (Right) - 36 minutes    DISPOSITION:  Stable to PACU.   INDICATIONS: Patient is a 54 year old female with masses on the palmar aspect of her right middle and right small fingers the small fingers at the metacarpal phalangeal joint crease the middle finger is at the mid PIP just distal to the webspace radial side of her middle finger.  She is desirous having these excised.  Pre-peripostoperative course been discussed along with risk complications.  She is aware there is no guarantee to the surgery the possibility of infection recurrence injury to arteries nerves tendons complete relief symptoms dystrophy.  Noted on ultrasound that the cyst on the middle finger is directly adjacent to the neurovascular bundle which is attended by it.  Preoperative area the patient is seen extremity marked by both patient and surgeon antibiotic given  OPERATIVE COURSE: Patient is brought to the operating room where form IV regional anesthetic was carried out without difficulty under the direction of the anesthesia department in the supine position with the right arm free.  She was prepped using ChloraPrep 3-minute dry time was allowed timeout taken to  confirm patient procedure.  An oblique incision was made over the cyst on the small finger carried down through subcutaneous tissue.  Neurovascular structures identified and protected.  Retractors were placed and the cyst was removed which was just over the beginning of the A1 pulley this was excised along with tenosynovial tissue proximally to it.  The finger was placed through full range of motion no triggering was noted.  The wound was copious irrigated with saline closed interrupted 4-0 nylon sutures.  A volar Bruner incision was then made over the right middle finger carried down through subcutaneous tissue.  Neurovascular bundles were identified proximally on the radial side retractors were placed after this was dissected free from a large cyst on the volar radial aspect of the midportion of the A2 pulley.  With blunt sharp dissection this was dissected free and measured approximately a centimeter and 1/2 to 2 cm and 7/10.  This was excised in toto.  The finger probe placed through full range of motion no triggering was noted.  The neurovascular bundle was protected throughout the procedure.  The wound was copious irrigated with saline.  A block was then made given at the metacarpal phalangeal joint at each of the digits.  This was done with quarter percent bupivacaine without epinephrine approximately 8 cc was used.  A sterile compressive dressing was applied with the fingers free.  Deflation of the tourniquet all fingers immediately pink.  She was taken to the recovery room for observation in satisfactory condition.  She will be discharged home  to return to the [facility] in 1 week Tylenol ibuprofen for pain with Ultram for breakthrough.   Cindee Salt, MD Electronically signed, 02/13/20

## 2020-02-13 NOTE — Discharge Instructions (Signed)

## 2020-02-13 NOTE — Anesthesia Preprocedure Evaluation (Signed)
Anesthesia Evaluation  Patient identified by MRN, date of birth, ID band Patient awake    Reviewed: Allergy & Precautions, NPO status , Patient's Chart, lab work & pertinent test results  Airway Mallampati: II  TM Distance: >3 FB Neck ROM: Full    Dental no notable dental hx.    Pulmonary neg pulmonary ROS,    Pulmonary exam normal breath sounds clear to auscultation       Cardiovascular negative cardio ROS Normal cardiovascular exam Rhythm:Regular Rate:Normal     Neuro/Psych negative neurological ROS  negative psych ROS   GI/Hepatic negative GI ROS, Neg liver ROS,   Endo/Other  negative endocrine ROS  Renal/GU negative Renal ROS  negative genitourinary   Musculoskeletal negative musculoskeletal ROS (+)   Abdominal   Peds negative pediatric ROS (+)  Hematology negative hematology ROS (+)   Anesthesia Other Findings   Reproductive/Obstetrics negative OB ROS                             Anesthesia Physical Anesthesia Plan  ASA: II  Anesthesia Plan: Bier Block and Bier Block-LIDOCAINE ONLY   Post-op Pain Management:    Induction: Intravenous  PONV Risk Score and Plan: 2 and Ondansetron, Midazolam and Treatment may vary due to age or medical condition  Airway Management Planned: Simple Face Mask  Additional Equipment:   Intra-op Plan:   Post-operative Plan:   Informed Consent: I have reviewed the patients History and Physical, chart, labs and discussed the procedure including the risks, benefits and alternatives for the proposed anesthesia with the patient or authorized representative who has indicated his/her understanding and acceptance.     Dental advisory given  Plan Discussed with: CRNA  Anesthesia Plan Comments:         Anesthesia Quick Evaluation

## 2020-02-14 ENCOUNTER — Encounter (HOSPITAL_BASED_OUTPATIENT_CLINIC_OR_DEPARTMENT_OTHER): Payer: Self-pay | Admitting: Orthopedic Surgery

## 2020-02-14 LAB — SURGICAL PATHOLOGY

## 2020-03-02 ENCOUNTER — Other Ambulatory Visit (HOSPITAL_COMMUNITY): Payer: Self-pay | Admitting: Adult Health

## 2020-03-02 DIAGNOSIS — Z1231 Encounter for screening mammogram for malignant neoplasm of breast: Secondary | ICD-10-CM

## 2020-03-27 DIAGNOSIS — H40003 Preglaucoma, unspecified, bilateral: Secondary | ICD-10-CM | POA: Diagnosis not present

## 2020-04-01 ENCOUNTER — Ambulatory Visit (HOSPITAL_COMMUNITY)
Admission: RE | Admit: 2020-04-01 | Discharge: 2020-04-01 | Disposition: A | Discharge status: Home / Self Care | Payer: BC Managed Care – PPO | Source: Ambulatory Visit | Attending: Adult Health | Admitting: Adult Health

## 2020-04-01 ENCOUNTER — Other Ambulatory Visit: Payer: Self-pay

## 2020-04-01 DIAGNOSIS — Z1231 Encounter for screening mammogram for malignant neoplasm of breast: Secondary | ICD-10-CM | POA: Diagnosis not present

## 2020-08-20 ENCOUNTER — Other Ambulatory Visit: Payer: Self-pay

## 2020-08-20 ENCOUNTER — Other Ambulatory Visit (HOSPITAL_COMMUNITY): Payer: Self-pay | Admitting: Family Medicine

## 2020-08-20 ENCOUNTER — Other Ambulatory Visit (HOSPITAL_COMMUNITY): Payer: Self-pay | Admitting: Emergency Medicine

## 2020-08-20 ENCOUNTER — Ambulatory Visit (HOSPITAL_COMMUNITY)
Admission: RE | Admit: 2020-08-20 | Discharge: 2020-08-20 | Disposition: A | Discharge status: Home / Self Care | Payer: BC Managed Care – PPO | Source: Ambulatory Visit | Attending: Family Medicine | Admitting: Family Medicine

## 2020-08-20 DIAGNOSIS — Z6827 Body mass index (BMI) 27.0-27.9, adult: Secondary | ICD-10-CM | POA: Diagnosis not present

## 2020-08-20 DIAGNOSIS — M810 Age-related osteoporosis without current pathological fracture: Secondary | ICD-10-CM | POA: Diagnosis not present

## 2020-08-20 DIAGNOSIS — M5136 Other intervertebral disc degeneration, lumbar region: Secondary | ICD-10-CM | POA: Diagnosis not present

## 2020-08-20 DIAGNOSIS — E7849 Other hyperlipidemia: Secondary | ICD-10-CM | POA: Diagnosis not present

## 2020-08-20 DIAGNOSIS — M25552 Pain in left hip: Secondary | ICD-10-CM | POA: Diagnosis not present

## 2020-08-20 DIAGNOSIS — M1991 Primary osteoarthritis, unspecified site: Secondary | ICD-10-CM | POA: Insufficient documentation

## 2020-08-20 DIAGNOSIS — Z1331 Encounter for screening for depression: Secondary | ICD-10-CM | POA: Diagnosis not present

## 2020-08-20 DIAGNOSIS — Z1389 Encounter for screening for other disorder: Secondary | ICD-10-CM | POA: Diagnosis not present

## 2020-08-20 DIAGNOSIS — E890 Postprocedural hypothyroidism: Secondary | ICD-10-CM | POA: Diagnosis not present

## 2020-08-20 DIAGNOSIS — E663 Overweight: Secondary | ICD-10-CM | POA: Diagnosis not present

## 2020-09-03 ENCOUNTER — Other Ambulatory Visit (HOSPITAL_COMMUNITY): Payer: Self-pay

## 2020-09-03 NOTE — Telephone Encounter (Signed)
Encounter opened in error please disregard

## 2020-09-08 DIAGNOSIS — M5136 Other intervertebral disc degeneration, lumbar region: Secondary | ICD-10-CM | POA: Diagnosis not present

## 2020-09-08 DIAGNOSIS — E663 Overweight: Secondary | ICD-10-CM | POA: Diagnosis not present

## 2020-09-08 DIAGNOSIS — Z0000 Encounter for general adult medical examination without abnormal findings: Secondary | ICD-10-CM | POA: Diagnosis not present

## 2020-09-08 DIAGNOSIS — E890 Postprocedural hypothyroidism: Secondary | ICD-10-CM | POA: Diagnosis not present

## 2020-09-08 DIAGNOSIS — M1991 Primary osteoarthritis, unspecified site: Secondary | ICD-10-CM | POA: Diagnosis not present

## 2020-09-08 DIAGNOSIS — Z6827 Body mass index (BMI) 27.0-27.9, adult: Secondary | ICD-10-CM | POA: Diagnosis not present

## 2021-03-09 ENCOUNTER — Other Ambulatory Visit (HOSPITAL_COMMUNITY): Payer: Self-pay | Admitting: Family Medicine

## 2021-03-09 DIAGNOSIS — Z1231 Encounter for screening mammogram for malignant neoplasm of breast: Secondary | ICD-10-CM

## 2021-04-07 ENCOUNTER — Other Ambulatory Visit: Payer: Self-pay

## 2021-04-07 ENCOUNTER — Ambulatory Visit (HOSPITAL_COMMUNITY)
Admission: RE | Admit: 2021-04-07 | Discharge: 2021-04-07 | Disposition: A | Discharge status: Home / Self Care | Payer: BC Managed Care – PPO | Source: Ambulatory Visit | Attending: Family Medicine | Admitting: Family Medicine

## 2021-04-07 DIAGNOSIS — Z1231 Encounter for screening mammogram for malignant neoplasm of breast: Secondary | ICD-10-CM | POA: Insufficient documentation

## 2021-08-24 DIAGNOSIS — Z6826 Body mass index (BMI) 26.0-26.9, adult: Secondary | ICD-10-CM | POA: Diagnosis not present

## 2021-08-24 DIAGNOSIS — E782 Mixed hyperlipidemia: Secondary | ICD-10-CM | POA: Diagnosis not present

## 2021-08-24 DIAGNOSIS — E7849 Other hyperlipidemia: Secondary | ICD-10-CM | POA: Diagnosis not present

## 2021-08-24 DIAGNOSIS — M1991 Primary osteoarthritis, unspecified site: Secondary | ICD-10-CM | POA: Diagnosis not present

## 2021-08-24 DIAGNOSIS — M810 Age-related osteoporosis without current pathological fracture: Secondary | ICD-10-CM | POA: Diagnosis not present

## 2021-08-24 DIAGNOSIS — Z1331 Encounter for screening for depression: Secondary | ICD-10-CM | POA: Diagnosis not present

## 2021-08-24 DIAGNOSIS — E663 Overweight: Secondary | ICD-10-CM | POA: Diagnosis not present

## 2021-08-24 DIAGNOSIS — Z0000 Encounter for general adult medical examination without abnormal findings: Secondary | ICD-10-CM | POA: Diagnosis not present

## 2021-08-24 DIAGNOSIS — E890 Postprocedural hypothyroidism: Secondary | ICD-10-CM | POA: Diagnosis not present

## 2022-03-23 ENCOUNTER — Other Ambulatory Visit (HOSPITAL_COMMUNITY): Payer: Self-pay | Admitting: Family Medicine

## 2022-03-23 DIAGNOSIS — Z1231 Encounter for screening mammogram for malignant neoplasm of breast: Secondary | ICD-10-CM

## 2022-04-13 ENCOUNTER — Ambulatory Visit (HOSPITAL_COMMUNITY)
Admission: RE | Admit: 2022-04-13 | Discharge: 2022-04-13 | Disposition: A | Discharge status: Home / Self Care | Payer: BC Managed Care – PPO | Source: Ambulatory Visit | Attending: Family Medicine | Admitting: Family Medicine

## 2022-04-13 DIAGNOSIS — Z1231 Encounter for screening mammogram for malignant neoplasm of breast: Secondary | ICD-10-CM | POA: Diagnosis not present

## 2022-09-09 DIAGNOSIS — E663 Overweight: Secondary | ICD-10-CM | POA: Diagnosis not present

## 2022-09-09 DIAGNOSIS — Z6826 Body mass index (BMI) 26.0-26.9, adult: Secondary | ICD-10-CM | POA: Diagnosis not present

## 2022-09-09 DIAGNOSIS — E782 Mixed hyperlipidemia: Secondary | ICD-10-CM | POA: Diagnosis not present

## 2022-09-09 DIAGNOSIS — M810 Age-related osteoporosis without current pathological fracture: Secondary | ICD-10-CM | POA: Diagnosis not present

## 2022-09-09 DIAGNOSIS — E7849 Other hyperlipidemia: Secondary | ICD-10-CM | POA: Diagnosis not present

## 2022-09-09 DIAGNOSIS — Z1331 Encounter for screening for depression: Secondary | ICD-10-CM | POA: Diagnosis not present

## 2022-09-09 DIAGNOSIS — R7309 Other abnormal glucose: Secondary | ICD-10-CM | POA: Diagnosis not present

## 2022-09-09 DIAGNOSIS — Z0000 Encounter for general adult medical examination without abnormal findings: Secondary | ICD-10-CM | POA: Diagnosis not present

## 2022-09-09 DIAGNOSIS — E890 Postprocedural hypothyroidism: Secondary | ICD-10-CM | POA: Diagnosis not present

## 2022-09-09 DIAGNOSIS — M5136 Other intervertebral disc degeneration, lumbar region: Secondary | ICD-10-CM | POA: Diagnosis not present

## 2023-03-23 ENCOUNTER — Other Ambulatory Visit (HOSPITAL_COMMUNITY): Payer: Self-pay | Admitting: Adult Health

## 2023-03-23 DIAGNOSIS — Z1231 Encounter for screening mammogram for malignant neoplasm of breast: Secondary | ICD-10-CM

## 2023-04-21 ENCOUNTER — Ambulatory Visit (HOSPITAL_COMMUNITY)
Admission: RE | Admit: 2023-04-21 | Discharge: 2023-04-21 | Disposition: A | Discharge status: Home / Self Care | Payer: BC Managed Care – PPO | Source: Ambulatory Visit | Attending: Adult Health | Admitting: Adult Health

## 2023-04-21 ENCOUNTER — Inpatient Hospital Stay (HOSPITAL_COMMUNITY): Admission: RE | Admit: 2023-04-21 | Payer: BC Managed Care – PPO | Source: Ambulatory Visit

## 2023-04-21 DIAGNOSIS — Z1231 Encounter for screening mammogram for malignant neoplasm of breast: Secondary | ICD-10-CM | POA: Insufficient documentation

## 2023-04-24 ENCOUNTER — Telehealth: Payer: Self-pay | Admitting: *Deleted

## 2023-04-24 NOTE — Telephone Encounter (Signed)
-----   Message from Cyril Mourning sent at 04/24/2023  2:07 PM EST ----- Let her know mammogram was negative, THX

## 2023-04-24 NOTE — Telephone Encounter (Signed)
 Left message @ 4:14 pm, letting pt know mammogram was negative. Repeat in 1 year. JSY

## 2023-09-08 DIAGNOSIS — Z1331 Encounter for screening for depression: Secondary | ICD-10-CM | POA: Diagnosis not present

## 2023-09-08 DIAGNOSIS — Z6825 Body mass index (BMI) 25.0-25.9, adult: Secondary | ICD-10-CM | POA: Diagnosis not present

## 2023-09-08 DIAGNOSIS — E890 Postprocedural hypothyroidism: Secondary | ICD-10-CM | POA: Diagnosis not present

## 2023-09-08 DIAGNOSIS — E7849 Other hyperlipidemia: Secondary | ICD-10-CM | POA: Diagnosis not present

## 2023-09-08 DIAGNOSIS — Z0000 Encounter for general adult medical examination without abnormal findings: Secondary | ICD-10-CM | POA: Diagnosis not present

## 2023-09-08 DIAGNOSIS — E782 Mixed hyperlipidemia: Secondary | ICD-10-CM | POA: Diagnosis not present

## 2023-09-08 DIAGNOSIS — E663 Overweight: Secondary | ICD-10-CM | POA: Diagnosis not present

## 2023-09-13 ENCOUNTER — Encounter (INDEPENDENT_AMBULATORY_CARE_PROVIDER_SITE_OTHER): Payer: Self-pay | Admitting: *Deleted

## 2023-09-29 ENCOUNTER — Telehealth: Payer: Self-pay

## 2023-09-29 NOTE — Telephone Encounter (Signed)
 Who is your primary care physician: Dr.John Glady Laming  Reasons for the colonoscopy: screening  Have you had a colonoscopy before?  Yes 09-26-2013 Dr.Rourk  Do you have family history of colon cancer? yes  Previous colonoscopy with polyps removed? no  Do you have a history colorectal cancer?   no  Are you diabetic? If yes, Type 1 or Type 2?    no  Do you have a prosthetic or mechanical heart valve? no  Do you have a pacemaker/defibrillator?   no  Have you had endocarditis/atrial fibrillation? no  Have you had joint replacement within the last 12 months?  no  Do you tend to be constipated or have to use laxatives? no  Do you have any history of drugs or alchohol?  Yes alcohol   Do you use supplemental oxygen?  no  Have you had a stroke or heart attack within the last 6 months? no  Do you take weight loss medication?  no  For female patients: have you had a hysterectomy?  no                                     are you post menopausal?       no                                            do you still have your menstrual cycle? no      Do you take any blood-thinning medications such as: (aspirin, warfarin, Plavix, Aggrenox)  no  If yes we need the name, milligram, dosage and who is prescribing doctor  Current Outpatient Medications on File Prior to Visit  Medication Sig Dispense Refill   Ascorbic Acid (VITAMIN C PO) Take by mouth.     celecoxib (CELEBREX) 200 MG capsule Take 200 mg by mouth 2 (two) times daily.     Cholecalciferol (VITAMIN D3 PO) Take by mouth.     gabapentin  (NEURONTIN ) 100 MG capsule TAKE 3 CAPSULES BY MOUTH AT BEDTIME 90 capsule 2   levothyroxine (SYNTHROID, LEVOTHROID) 88 MCG tablet Take 88 mcg by mouth daily.  11   MAGNESIUM PO Take by mouth.     Multiple Vitamin (MULTIVITAMIN) tablet Take 1 tablet by mouth daily.     Omega-3 Fatty Acids (OMEGA 3 PO) Take by mouth. Takes 2 daily     PREBIOTIC PRODUCT PO Take by mouth.     Probiotic Product (PROBIOTIC PO)  Take by mouth.     traMADol  (ULTRAM ) 50 MG tablet Take 1 tablet (50 mg total) by mouth every 6 (six) hours as needed. 20 tablet 0   No current facility-administered medications on file prior to visit.    No Known Allergies   Pharmacy: [facility]  Primary Insurance Name: [employer] [id number]  Best number where you can be reached: [phone]

## 2023-11-14 NOTE — Telephone Encounter (Signed)
 Appropriate. Asa 2.

## 2023-11-16 NOTE — Telephone Encounter (Signed)
LMOVM to call back to schedule 

## 2023-11-23 NOTE — Telephone Encounter (Signed)
 Called pt, VM now full. Will mail letter

## 2023-12-01 MED ORDER — PEG 3350-KCL-NA BICARB-NACL 420 G PO SOLR: 4000.0000 mL | Freq: Once | ORAL | 0 refills | Status: AC

## 2023-12-01 NOTE — Addendum Note (Signed)
 Addended by: JEANELL GRAEME RAMAN on: 12/01/2023 10:41 AM   Modules accepted: Orders

## 2023-12-01 NOTE — Telephone Encounter (Signed)
 Pt called in and has been scheduled for 9/29. Aware will mail instructions and send rx for prep to pharmacy.

## 2023-12-04 ENCOUNTER — Encounter (INDEPENDENT_AMBULATORY_CARE_PROVIDER_SITE_OTHER): Payer: Self-pay | Admitting: *Deleted

## 2023-12-04 NOTE — Telephone Encounter (Signed)
 Referral completed, [facility] apt letter sent to PCP

## 2024-01-08 ENCOUNTER — Encounter (HOSPITAL_COMMUNITY): Payer: Self-pay | Admitting: Internal Medicine

## 2024-01-08 ENCOUNTER — Ambulatory Visit (HOSPITAL_COMMUNITY): Admitting: Anesthesiology

## 2024-01-08 ENCOUNTER — Encounter (HOSPITAL_COMMUNITY)
Admission: RE | Disposition: A | Discharge status: Home / Self Care | Payer: Self-pay | Source: Home / Self Care | Attending: Internal Medicine

## 2024-01-08 ENCOUNTER — Ambulatory Visit (HOSPITAL_COMMUNITY)
Admission: RE | Admit: 2024-01-08 | Discharge: 2024-01-08 | Disposition: A | Discharge status: Home / Self Care | Attending: Internal Medicine | Admitting: Internal Medicine

## 2024-01-08 ENCOUNTER — Other Ambulatory Visit: Payer: Self-pay

## 2024-01-08 DIAGNOSIS — K5730 Diverticulosis of large intestine without perforation or abscess without bleeding: Secondary | ICD-10-CM | POA: Diagnosis not present

## 2024-01-08 DIAGNOSIS — Z800 Family history of malignant neoplasm of digestive organs: Secondary | ICD-10-CM

## 2024-01-08 DIAGNOSIS — Z860100 Personal history of colon polyps, unspecified: Secondary | ICD-10-CM

## 2024-01-08 DIAGNOSIS — Z1211 Encounter for screening for malignant neoplasm of colon: Secondary | ICD-10-CM | POA: Diagnosis not present

## 2024-01-08 DIAGNOSIS — E079 Disorder of thyroid, unspecified: Secondary | ICD-10-CM | POA: Diagnosis not present

## 2024-01-08 HISTORY — PX: COLONOSCOPY: SHX5424

## 2024-01-08 SURGERY — COLONOSCOPY
Anesthesia: General

## 2024-01-08 MED ORDER — PROPOFOL 500 MG/50ML IV EMUL
INTRAVENOUS | Status: DC | PRN
  Administered 2024-01-08: 200 ug/kg/min via INTRAVENOUS

## 2024-01-08 MED ORDER — LACTATED RINGERS IV SOLN: INTRAVENOUS | Status: DC

## 2024-01-08 MED ORDER — PROPOFOL 10 MG/ML IV BOLUS
INTRAVENOUS | Status: DC | PRN
  Administered 2024-01-08: 100 mg via INTRAVENOUS

## 2024-01-08 NOTE — Anesthesia Postprocedure Evaluation (Signed)
 Anesthesia Post Note  Patient: Tammy [name]  Procedure(s) Performed: COLONOSCOPY  Patient location during evaluation: Endoscopy Anesthesia Type: General Level of consciousness: awake and alert Pain management: pain level controlled Vital Signs Assessment: post-procedure vital signs reviewed and stable Respiratory status: spontaneous breathing, nonlabored ventilation and respiratory function stable Cardiovascular status: stable Anesthetic complications: no   There were no known notable events for this encounter.   Last Vitals:  Vitals:   01/08/24 1250 01/08/24 1409  BP: (!) 149/89 132/82  Pulse: 90 87  Resp: 18 18  Temp: 36.7 C 36.7 C  SpO2: 96% 95%    Last Pain:  Vitals:   01/08/24 1409  TempSrc:   PainSc: 0-No pain                 Jlyn Cerros L Meghna Hagmann

## 2024-01-08 NOTE — H&P (Signed)
 @LOGO @   Gastroenterology Progress Note    Primary Care Physician:  [name], MD Primary Gastroenterologist:  Dr. [name]  Pre-Procedure History & Physical: HPI:  Tammy [name] is a 58 y.o. female here for   Surveillance colonoscopy.  Distant history colonic adenoma positive family history colon cancer.  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 Tubular adenoma   COLONOSCOPY N/A 09/26/2013   Procedure: COLONOSCOPY;  Surgeon: Lamar CHRISTELLA [name], MD;  [facility]: [facility];  Service: Endoscopy;  Laterality: N/A;  12:15   CYST EXCISION Right 02/13/2020   Procedure: CYST REMOVAL RIGHT MIDDLE AND SMALL FINGERS;  Surgeon: [name], MD;  Location: [facility];  Service: Orthopedics;  Laterality: Right;  IV REGIONAL FOREARM BLOCK   FOOT SURGERY Right    thyroid  removed  1987   TUBAL LIGATION      Prior to Admission medications   Medication Sig Start Date End Date Taking? Authorizing Provider  gabapentin  (NEURONTIN ) 300 MG capsule Take 300 mg by mouth 3 (three) times daily.   Yes [provider]  levothyroxine (SYNTHROID, LEVOTHROID) 88 MCG tablet Take 88 mcg by mouth daily. 09/28/17  Yes [provider]    Allergies as of 12/01/2023   (No Known Allergies)    Family History  Problem Relation Age of Onset   Hypertension Mother    Cancer Sister 66       colon   Hypertension Maternal Aunt    Hyperlipidemia Maternal Aunt    Cancer Maternal Aunt        ovarian; had hyst   Cancer Maternal Uncle        lung    Social History   Socioeconomic History   Marital status: Single    Spouse name: Tammy [name]   Number of children: Tammy [name]    Years of education: Tammy [name]   Highest education level: Tammy [name]  Occupational History   Tammy [name]  Tobacco Use   Smoking status: Never   Smokeless tobacco: Never  Substance and Sexual Activity   Alcohol use: Yes    Comment: occ   Drug use: No   Sexual activity: Yes    Birth control/protection: Surgical, Post-menopausal    Comment: tubal  Other Topics Concern   Tammy [name]  Social History Narrative   Tammy [name]   Social Drivers of Health   Financial Resource Strain: Tammy [name]  Food Insecurity: Tammy [name]  Transportation Needs: Tammy [name]  Physical Activity: Tammy [name]  Stress: Tammy [name]  Social Connections: Tammy [name]  Intimate Partner Violence: Tammy [name]    Review of Systems   See HPI, otherwise negative ROS  Physical Exam: BP (!) 149/89   Pulse 90   Temp 98 F (36.7 C) (Oral)   Resp 18   Ht 5' 7 (1.702 m)   Wt 71.2 kg   SpO2 96%   BMI 24.59 kg/m  General:   Alert,  Well-developed, well-nourished, pleasant and cooperative in NAD Neck:  Supple; no masses or thyromegaly. No significant cervical adenopathy. Lungs:  Clear throughout to auscultation.  No wheezes, crackles, or rhonchi. No acute distress. Heart:  Regular rate and rhythm; no murmurs, clicks, rubs,  or gallops. Abdomen: Non-distended, normal bowel sounds.  Soft and nontender without appreciable mass or hepatosplenomegaly.    Impression/Plan:     58 year old lady here for surveillance colonoscopy.  Positive family history and her younger sister (age 37).  I have offered her colonoscopy today per plan.  The risks, benefits, limitations, alternatives and imponderables have been reviewed with the patient. Questions have been answered. All parties are agreeable.      Notice: This dictation was prepared with Dragon dictation along with smaller phrase technology. Any transcriptional errors that result from this process are unintentional and may Tammy be corrected upon review.

## 2024-01-08 NOTE — Discharge Instructions (Addendum)
  Colonoscopy Discharge Instructions  Read the instructions outlined below and refer to this sheet in the next few weeks. These discharge instructions provide you with general information on caring for yourself after you leave the hospital. Your doctor may also give you specific instructions. While your treatment has been planned according to the most current medical practices available, unavoidable complications occasionally occur. If you have any problems or questions after discharge, call Dr. [name] at [phone]. ACTIVITY You may resume your regular activity, but move at a slower pace for the next 24 hours.  Take frequent rest periods for the next 24 hours.  Walking will help get rid of the air and reduce the bloated feeling in your belly (abdomen).  No driving for 24 hours (because of the medicine (anesthesia) used during the test).   Do not sign any important legal documents or operate any machinery for 24 hours (because of the anesthesia used during the test).  NUTRITION Drink plenty of fluids.  You may resume your normal diet as instructed by your doctor.  Begin with a light meal and progress to your normal diet. Heavy or fried foods are harder to digest and may make you feel sick to your stomach (nauseated).  Avoid alcoholic beverages for 24 hours or as instructed.  MEDICATIONS You may resume your normal medications unless your doctor tells you otherwise.  WHAT YOU CAN EXPECT TODAY Some feelings of bloating in the abdomen.  Passage of more gas than usual.  Spotting of blood in your stool or on the toilet paper.  IF YOU HAD POLYPS REMOVED DURING THE COLONOSCOPY: No aspirin products for 7 days or as instructed.  No alcohol for 7 days or as instructed.  Eat a soft diet for the next 24 hours.  FINDING OUT THE RESULTS OF YOUR TEST Not all test results are available during your visit. If your test results are not back during the visit, make an appointment with your caregiver to find out the  results. Do not assume everything is normal if you have not heard from your caregiver or the medical facility. It is important for you to follow up on all of your test results.  SEEK IMMEDIATE MEDICAL ATTENTION IF: You have more than a spotting of blood in your stool.  Your belly is swollen (abdominal distention).  You are nauseated or vomiting.  You have a temperature over 101.  You have abdominal pain or discomfort that is severe or gets worse throughout the day.      diverticulosis only found today no polyps.  Repeat colonoscopy in 5 years.

## 2024-01-08 NOTE — Transfer of Care (Signed)
 Immediate Anesthesia Transfer of Care Note  Patient: Tammy [name]  Procedure(s) Performed: COLONOSCOPY  Patient Location: Endoscopy Unit  Anesthesia Type:General  Level of Consciousness: awake, alert , oriented, and patient cooperative  Airway & Oxygen Therapy: Patient Spontanous Breathing  Post-op Assessment: Report given to RN, Post -op Vital signs reviewed and stable, and Patient moving all extremities X 4  Post vital signs: Reviewed and stable  Last Vitals:  Vitals Value Taken Time  BP 132/82 01/08/24 14:09  Temp 36.7 C 01/08/24 14:09  Pulse 87 01/08/24 14:09  Resp 18 01/08/24 14:09  SpO2 95 % 01/08/24 14:09    Last Pain:  Vitals:   01/08/24 1409  TempSrc:   PainSc: 0-No pain      Patients Stated Pain Goal: 7 (01/08/24 1250)  Complications: No notable events documented.

## 2024-01-08 NOTE — Op Note (Signed)
 [facility] Patient Name: Tammy [name] Procedure Date: 01/08/2024 1:23 PM MRN: [id number] Date of Birth: [birth year] Attending MD: Lamar Ozell Hollingshead , MD, [id number] CSN: [id number] Age: 58 Admit Type: Outpatient Procedure:                Colonoscopy Indications:              High risk colon cancer surveillance: Personal                            history of colonic polyps; younger sister with                            colon cancer Providers:                Lamar Ozell Hollingshead, MD, Harlene Lips, Daphne Mulch Technician, Technician Referring MD:              Medicines:                Propofol  per Anesthesia Complications:            No immediate complications. Estimated Blood Loss:     Estimated blood loss: none. Procedure:                Pre-Anesthesia Assessment:                           - Prior to the procedure, a History and Physical                            was performed, and patient medications and                            allergies were reviewed. The patient's tolerance of                            previous anesthesia was also reviewed. The risks                            and benefits of the procedure and the sedation                            options and risks were discussed with the patient.                            All questions were answered, and informed consent                            was obtained. Prior Anticoagulants: The patient has                            taken no anticoagulant or antiplatelet agents. ASA                            Grade  Assessment: III - A patient with severe                            systemic disease. After reviewing the risks and                            benefits, the patient was deemed in satisfactory                            condition to undergo the procedure.                           After obtaining informed consent, the colonoscope                            was passed under direct  vision. Throughout the                            procedure, the patient's blood pressure, pulse, and                            oxygen saturations were monitored continuously. The                            [id number] ([id number]) Colon was introduced through                            the anus and advanced to the the cecum, identified                            by appendiceal orifice and ileocecal valve. The                            colonoscopy was performed without difficulty. The                            patient tolerated the procedure well. The quality                            of the bowel preparation was adequate. The                            ileocecal valve, appendiceal orifice, and rectum                            were photographed. Scope In: 1:51:44 PM Scope Out: 2:03:44 PM Scope Withdrawal Time: 0 hours 6 minutes 6 seconds  Total Procedure Duration: 0 hours 12 minutes 0 seconds  Findings:      The perianal and digital rectal examinations were normal.      Scattered medium-mouthed diverticula were found in the entire colon.      The exam was otherwise without abnormality on direct and retroflexion       views. Impression:               -  Diverticulosis in the entire examined colon.                           - The examination was otherwise normal on direct                            and retroflexion views.                           - No specimens collected. Moderate Sedation:      Moderate (conscious) sedation was personally administered by an       anesthesia professional. The following parameters were monitored: oxygen       saturation, heart rate, blood pressure, respiratory rate, EKG, adequacy       of pulmonary ventilation, and response to care. Recommendation:           - Patient has a contact number available for                            emergencies. The signs and symptoms of potential                            delayed complications were discussed with the                             patient. Return to normal activities tomorrow.                            Written discharge instructions were provided to the                            patient.                           - Advance diet as tolerated.                           - Continue present medications.                           - Repeat colonoscopy in 5 years for screening                            purposes.                           - Return to GI office (date not yet determined). Procedure Code(s):        --- Professional ---                           [id number], Colonoscopy, flexible; diagnostic, including                            collection of specimen(s) by brushing or washing,  when performed (separate procedure) Diagnosis Code(s):        --- Professional ---                           Z86.010, Personal history of colonic polyps                           K57.30, Diverticulosis of large intestine without                            perforation or abscess without bleeding CPT copyright 2022 [employer]. All rights reserved. The codes documented in this report are preliminary and upon coder review may  be revised to meet current compliance requirements. Lamar HERO. Darrelle Barrell, MD Lamar Ozell Hollingshead, MD 01/08/2024 2:23:19 PM This report has been signed electronically. Number of Addenda: 0

## 2024-01-08 NOTE — Anesthesia Preprocedure Evaluation (Signed)
 Anesthesia Evaluation  Patient identified by MRN, date of birth, ID band Patient awake    Reviewed: Allergy & Precautions, NPO status , Patient's Chart, lab work & pertinent test results  Airway Mallampati: II  TM Distance: >3 FB Neck ROM: Full    Dental no notable dental hx. (+) Dental Advisory Given, Teeth Intact   Pulmonary neg pulmonary ROS   Pulmonary exam normal breath sounds clear to auscultation       Cardiovascular negative cardio ROS Normal cardiovascular exam Rhythm:Regular Rate:Normal     Neuro/Psych negative neurological ROS  negative psych ROS   GI/Hepatic Neg liver ROS,,,Colon cancer   Endo/Other  Thyroid  disease history  Renal/GU negative Renal ROS  negative genitourinary   Musculoskeletal negative musculoskeletal ROS (+)    Abdominal   Peds negative pediatric ROS (+)  Hematology negative hematology ROS (+)   Anesthesia Other Findings   Reproductive/Obstetrics negative OB ROS                              Anesthesia Physical Anesthesia Plan  ASA: 2  Anesthesia Plan: General   Post-op Pain Management: Minimal or no pain anticipated   Induction: Intravenous  PONV Risk Score and Plan: Propofol  infusion  Airway Management Planned: Natural Airway and Nasal Cannula  Additional Equipment: None  Intra-op Plan:   Post-operative Plan:   Informed Consent: I have reviewed the patients History and Physical, chart, labs and discussed the procedure including the risks, benefits and alternatives for the proposed anesthesia with the patient or authorized representative who has indicated his/her understanding and acceptance.     Dental advisory given  Plan Discussed with: CRNA  Anesthesia Plan Comments:          Anesthesia Quick Evaluation

## 2024-01-09 ENCOUNTER — Encounter (HOSPITAL_COMMUNITY): Payer: Self-pay | Admitting: Internal Medicine

## 2024-04-22 ENCOUNTER — Other Ambulatory Visit (HOSPITAL_COMMUNITY): Payer: Self-pay | Admitting: Adult Health

## 2024-04-22 DIAGNOSIS — Z1231 Encounter for screening mammogram for malignant neoplasm of breast: Secondary | ICD-10-CM

## 2024-05-03 ENCOUNTER — Ambulatory Visit (HOSPITAL_COMMUNITY)
Admission: RE | Admit: 2024-05-03 | Discharge: 2024-05-03 | Disposition: A | Source: Ambulatory Visit | Attending: Adult Health | Admitting: Adult Health

## 2024-05-03 DIAGNOSIS — Z1231 Encounter for screening mammogram for malignant neoplasm of breast: Secondary | ICD-10-CM | POA: Diagnosis present

## 2024-05-07 ENCOUNTER — Ambulatory Visit: Payer: Self-pay | Admitting: Adult Health
